# Patient Record
Sex: Male | Born: 1935 | Race: White | Hispanic: No | Marital: Married | Smoking: Never smoker
Health system: Southern US, Community
[De-identification: ages and names within clinical notes are randomized; demographics above are authoritative.]

## PROBLEM LIST (undated history)

## (undated) DIAGNOSIS — G309 Alzheimer's disease, unspecified: Secondary | ICD-10-CM

## (undated) DIAGNOSIS — F0280 Dementia in other diseases classified elsewhere without behavioral disturbance: Secondary | ICD-10-CM

## (undated) DIAGNOSIS — I10 Essential (primary) hypertension: Secondary | ICD-10-CM

## (undated) HISTORY — PX: HERNIA REPAIR: SHX51

## (undated) HISTORY — PX: MOUTH SURGERY: SHX715

## (undated) HISTORY — DX: Essential (primary) hypertension: I10

---

## 2006-02-05 ENCOUNTER — Ambulatory Visit: Payer: Self-pay | Admitting: Family Medicine

## 2007-02-04 ENCOUNTER — Encounter: Payer: Self-pay | Admitting: Family Medicine

## 2007-03-01 ENCOUNTER — Encounter: Payer: Self-pay | Admitting: Family Medicine

## 2008-06-30 ENCOUNTER — Ambulatory Visit: Payer: Self-pay | Admitting: Internal Medicine

## 2008-07-19 ENCOUNTER — Ambulatory Visit: Payer: Self-pay | Admitting: Internal Medicine

## 2008-07-31 ENCOUNTER — Ambulatory Visit: Payer: Self-pay | Admitting: Internal Medicine

## 2014-12-25 ENCOUNTER — Emergency Department: Payer: Self-pay | Admitting: Internal Medicine

## 2014-12-25 LAB — CBC WITH DIFFERENTIAL/PLATELET
BASOS ABS: 0.1 10*3/uL (ref 0.0–0.1)
Basophil %: 0.8 %
Eosinophil #: 0.4 10*3/uL (ref 0.0–0.7)
Eosinophil %: 3.4 %
HCT: 46.5 % (ref 40.0–52.0)
HGB: 15.5 g/dL (ref 13.0–18.0)
LYMPHS ABS: 1.8 10*3/uL (ref 1.0–3.6)
Lymphocyte %: 17.3 %
MCH: 31.2 pg (ref 26.0–34.0)
MCHC: 33.3 g/dL (ref 32.0–36.0)
MCV: 94 fL (ref 80–100)
MONO ABS: 1 x10 3/mm (ref 0.2–1.0)
MONOS PCT: 9.2 %
NEUTROS ABS: 7.2 10*3/uL — AB (ref 1.4–6.5)
Neutrophil %: 69.3 %
Platelet: 300 10*3/uL (ref 150–440)
RBC: 4.97 10*6/uL (ref 4.40–5.90)
RDW: 13.7 % (ref 11.5–14.5)
WBC: 10.4 10*3/uL (ref 3.8–10.6)

## 2014-12-25 LAB — BASIC METABOLIC PANEL
Anion Gap: 7 (ref 7–16)
BUN: 13 mg/dL
Calcium, Total: 9 mg/dL
Chloride: 101 mmol/L
Co2: 29 mmol/L
Creatinine: 0.79 mg/dL
EGFR (Non-African Amer.): >60
Glucose: 117 mg/dL — ABNORMAL HIGH
POTASSIUM: 3.7 mmol/L
Sodium: 137 mmol/L

## 2014-12-25 LAB — URINALYSIS, COMPLETE
Bacteria: NONE SEEN
Bilirubin,UR: NEGATIVE
GLUCOSE, UR: NEGATIVE mg/dL (ref 0–75)
Ketone: NEGATIVE
LEUKOCYTE ESTERASE: NEGATIVE
NITRITE: NEGATIVE
Ph: 5 (ref 4.5–8.0)
RBC,UR: <1 /HPF (ref 0–5)
Specific Gravity: 1.017 (ref 1.003–1.030)
Squamous Epithelial: NONE SEEN

## 2014-12-25 LAB — TROPONIN I

## 2015-02-14 ENCOUNTER — Other Ambulatory Visit: Payer: Self-pay | Admitting: Family Medicine

## 2015-02-14 ENCOUNTER — Ambulatory Visit
Admission: RE | Admit: 2015-02-14 | Discharge: 2015-02-14 | Disposition: A | Discharge status: Home / Self Care | Payer: PPO | Source: Ambulatory Visit | Attending: Family Medicine | Admitting: Family Medicine

## 2015-02-14 DIAGNOSIS — M25519 Pain in unspecified shoulder: Secondary | ICD-10-CM | POA: Insufficient documentation

## 2015-02-14 DIAGNOSIS — M5412 Radiculopathy, cervical region: Secondary | ICD-10-CM

## 2015-04-09 ENCOUNTER — Other Ambulatory Visit: Payer: Self-pay | Admitting: Family Medicine

## 2015-04-17 ENCOUNTER — Ambulatory Visit: Payer: Self-pay | Admitting: Family Medicine

## 2015-04-27 ENCOUNTER — Other Ambulatory Visit: Payer: Self-pay | Admitting: Family Medicine

## 2015-05-18 ENCOUNTER — Ambulatory Visit: Payer: Self-pay | Admitting: Family Medicine

## 2015-05-23 ENCOUNTER — Ambulatory Visit: Payer: Self-pay | Admitting: Family Medicine

## 2015-05-30 ENCOUNTER — Ambulatory Visit (INDEPENDENT_AMBULATORY_CARE_PROVIDER_SITE_OTHER): Payer: PPO

## 2015-05-30 DIAGNOSIS — Z23 Encounter for immunization: Secondary | ICD-10-CM | POA: Diagnosis not present

## 2015-07-20 ENCOUNTER — Ambulatory Visit: Payer: PPO | Admitting: Family Medicine

## 2015-07-25 ENCOUNTER — Ambulatory Visit: Payer: PPO | Admitting: Family Medicine

## 2015-10-19 ENCOUNTER — Ambulatory Visit: Payer: PPO | Admitting: Family Medicine

## 2015-12-07 ENCOUNTER — Ambulatory Visit: Payer: PPO | Admitting: Family Medicine

## 2016-01-17 ENCOUNTER — Ambulatory Visit: Payer: PPO | Admitting: Family Medicine

## 2016-02-24 ENCOUNTER — Other Ambulatory Visit: Payer: Self-pay | Admitting: Family Medicine

## 2016-05-06 ENCOUNTER — Ambulatory Visit: Payer: PPO | Admitting: Family Medicine

## 2016-05-17 ENCOUNTER — Other Ambulatory Visit: Payer: Self-pay | Admitting: Family Medicine

## 2016-05-27 NOTE — Telephone Encounter (Signed)
Pt.notified

## 2016-05-27 NOTE — Telephone Encounter (Signed)
Please find out in old med records system when patient was last actually seen in the office for a visit His last labs were over 17 months ago He has cancelled several appointments, including one with me August 7th and another one on August 31st I'm not comfortable sending in prescriptions if he's not compliant

## 2016-05-30 ENCOUNTER — Ambulatory Visit: Payer: PPO | Admitting: Family Medicine

## 2016-06-03 ENCOUNTER — Emergency Department
Admission: EM | Admit: 2016-06-03 | Discharge: 2016-06-04 | Disposition: A | Discharge status: Home / Self Care | Payer: PPO | Attending: Emergency Medicine | Admitting: Emergency Medicine

## 2016-06-03 ENCOUNTER — Encounter: Payer: Self-pay | Admitting: Emergency Medicine

## 2016-06-03 DIAGNOSIS — M25569 Pain in unspecified knee: Secondary | ICD-10-CM | POA: Diagnosis not present

## 2016-06-03 DIAGNOSIS — E876 Hypokalemia: Secondary | ICD-10-CM | POA: Diagnosis not present

## 2016-06-03 DIAGNOSIS — M79671 Pain in right foot: Secondary | ICD-10-CM | POA: Diagnosis not present

## 2016-06-03 DIAGNOSIS — M2550 Pain in unspecified joint: Secondary | ICD-10-CM | POA: Diagnosis not present

## 2016-06-03 DIAGNOSIS — K029 Dental caries, unspecified: Secondary | ICD-10-CM | POA: Insufficient documentation

## 2016-06-03 NOTE — ED Notes (Signed)
Patient denies pain and is resting comfortably.  

## 2016-06-03 NOTE — ED Notes (Signed)
Pt. Up attempting urine sample at this time.

## 2016-06-03 NOTE — ED Triage Notes (Signed)
Pt. C/o Right foot arch pain, that comes and goes, joint soreness generalized, and "bad teeth that need to come out that I think may be putting infection in my blood to make me feel like this."

## 2016-06-03 NOTE — ED Notes (Signed)
Due to pt. Suspicions of blood infection, rainbow labs, lactic acid, and 1 set of blood cultures have already been obtained and sent to lab, pending orders per MD evaluation.

## 2016-06-04 LAB — BASIC METABOLIC PANEL
ANION GAP: 9 (ref 5–15)
BUN: 11 mg/dL (ref 6–20)
CO2: 27 mmol/L (ref 22–32)
Calcium: 8.6 mg/dL — ABNORMAL LOW (ref 8.9–10.3)
Chloride: 101 mmol/L (ref 101–111)
Creatinine, Ser: 0.79 mg/dL (ref 0.61–1.24)
GFR calc Af Amer: >60 mL/min (ref >60)
GLUCOSE: 95 mg/dL (ref 65–99)
Potassium: 2.9 mmol/L — ABNORMAL LOW (ref 3.5–5.1)
Sodium: 137 mmol/L (ref 135–145)

## 2016-06-04 LAB — CBC
HEMATOCRIT: 38.2 % — AB (ref 40.0–52.0)
Hemoglobin: 13.3 g/dL (ref 13.0–18.0)
MCH: 31.7 pg (ref 26.0–34.0)
MCHC: 34.7 g/dL (ref 32.0–36.0)
MCV: 91.3 fL (ref 80.0–100.0)
Platelets: 405 10*3/uL (ref 150–440)
RBC: 4.19 MIL/uL — ABNORMAL LOW (ref 4.40–5.90)
RDW: 12.9 % (ref 11.5–14.5)
WBC: 11.1 10*3/uL — AB (ref 3.8–10.6)

## 2016-06-04 MED ORDER — POTASSIUM CHLORIDE CRYS ER 20 MEQ PO TBCR
40.0000 meq | EXTENDED_RELEASE_TABLET | Freq: Once | ORAL | Status: AC
  Administered 2016-06-04: 40 meq via ORAL
  Filled 2016-06-04: qty 2

## 2016-06-04 NOTE — ED Notes (Signed)
Family at bedside. 

## 2016-06-04 NOTE — ED Notes (Signed)
Vital signs stable. 

## 2016-06-04 NOTE — ED Provider Notes (Signed)
[facility] Emergency Department Provider Note   ____________________________________________   First MD Initiated Contact with Patient 06/03/16 2351     (approximate)  I have reviewed the triage vital signs and the nursing notes.   HISTORY  Chief Complaint Dental Injury; Foot Pain; and Joint Pain    HPI Brett Floyd is a 82 y.o. male comes into the hospital today with multiple complaints. He reports he had some problems with his right foot arch about 2 weeks ago. He reports that he had an injury 40 years ago but thinks it flared back up. He was taking ibuprofen and aspirin and the pain improved but he said that since then he's been having pain in his joints. He reports that his knees ankles and shoulders have been feeling bad. He reports it is sore feeling. The patient reports that his continued and he's feeling stiff. He reports that his son told him that he could have an infection in his blood from his rotten teeth and he is concerned he may have an infection in his bloodstream. He reports that he just doesn't feel good but rate that that the soreness in his joints. He denies any fevers, vomiting, nausea, dizziness, shortness of breath or chest pain. The patient reports he did have a cold sweat and he did feel like he has decreased energy. The patient reports that he just didn't like the way he was feeling which is why he came in to get some blood work to make sure he didn't have an infection spreading to his blood.The patient is here for evaluation.   History reviewed. No pertinent past medical history.  There are no active problems to display for this patient.   Past Surgical History:  Procedure Laterality Date  . HERNIA REPAIR    . MOUTH SURGERY      Prior to Admission medications   Medication Sig Start Date End Date Taking? Authorizing Provider  lisinopril (PRINIVIL,ZESTRIL) 10 MG tablet TAKE 1 TABLET BY MOUTH EVERYDAY 02/27/16   Ashok Norris, MD    Allergies Review of patient's allergies indicates no known allergies.  History reviewed. No pertinent family history.  Social History Social History  Substance Use Topics  . Smoking status: Never Smoker  . Smokeless tobacco: Never Used  . Alcohol use No    Review of Systems Constitutional: cold sweat Eyes: No visual changes. ENT: No sore throat. Cardiovascular: Denies chest pain. Respiratory: Denies shortness of breath. Gastrointestinal: No abdominal pain.  No nausea, no vomiting.  No diarrhea.  No constipation. Genitourinary: Negative for dysuria. Musculoskeletal: Arthralgia Skin: Negative for rash. Neurological: Negative for headaches, focal weakness or numbness.  10-point ROS otherwise negative.  ____________________________________________   PHYSICAL EXAM:  VITAL SIGNS: ED Triage Vitals  Enc Vitals Group     BP 06/03/16 2259 (!) 130/94     Pulse Rate 06/03/16 2259 97     Resp 06/03/16 2259 18     Temp 06/03/16 2259 98.5 F (36.9 C)     Temp Source 06/03/16 2259 Oral     SpO2 06/03/16 2259 97 %     Weight 06/03/16 2300 158 lb 3.2 oz (71.8 kg)     Height 06/03/16 2300 5\' 7"  (1.702 m)     Head Circumference --      Peak Flow --      Pain Score 06/03/16 2301 0     Pain Loc --      Pain Edu? --  Excl. in [place]? --     Constitutional: Alert and oriented. Well appearing and in no acute distress. Eyes: Conjunctivae are normal. PERRL. EOMI. Head: Atraumatic. Nose: No congestion/rhinnorhea. Mouth/Throat: Mucous membranes are moist.  Oropharynx non-erythematous. Cardiovascular: Normal rate, regular rhythm. Grossly normal heart sounds.  Good peripheral circulation. Respiratory: Normal respiratory effort.  No retractions. Lungs CTAB. Gastrointestinal: Soft and nontender. No distention. Positive bowel sounds Musculoskeletal: No lower extremity tenderness nor edema.   Neurologic:  Normal speech and language.  Skin:  Skin is warm, dry and intact.    Psychiatric: Mood and affect are normal.   ____________________________________________   LABS (all labs ordered are listed, but only abnormal results are displayed)  Labs Reviewed  CBC - Abnormal; Notable for the following:       Result Value   WBC 11.1 (*)    RBC 4.19 (*)    HCT 38.2 (*)    All other components within normal limits  BASIC METABOLIC PANEL - Abnormal; Notable for the following:    Potassium 2.9 (*)    Calcium 8.6 (*)    All other components within normal limits   ____________________________________________  EKG  ED ECG REPORT I, Loney Hering, the attending physician, personally viewed and interpreted this ECG.   Date: 06/03/2016  EKG Time: 2257  Rate: 98  Rhythm: normal sinus rhythm  Axis: normal  Intervals:none  ST&T Change: none  ____________________________________________  RADIOLOGY  none ____________________________________________   PROCEDURES  Procedure(s) performed: None  Procedures  Critical Care performed: No  ____________________________________________   INITIAL IMPRESSION / ASSESSMENT AND PLAN / ED COURSE  Pertinent labs & imaging results that were available during my care of the patient were reviewed by me and considered in my medical decision making (see chart for details).  This is an 82 year old male who comes into the hospital today with some body aches and chills. The patient is concerned that he may have an infection in his bloodstream due to some bad teeth. I will check some blood work to include a CBC and a BMP. I will reassess the patient once I received the results and determine if he needs any further studies or imaging. The patient otherwise is in no acute distress and no acute pain.  Clinical Course   Did give the patient a dose of potassium for his low potassium here. The patient's blood work otherwise is unremarkable. He has a white count of 11.1 but it is not severely abnormal from his previous.  He'll that the patient can be discharged home and should follow up with his primary care physician. He again is not having any pain at this time. He'll be discharged home.  ____________________________________________   FINAL CLINICAL IMPRESSION(S) / ED DIAGNOSES  Final diagnoses:  Hypokalemia  Arthralgia  Dental caries      NEW MEDICATIONS STARTED DURING THIS VISIT:  New Prescriptions   No medications on file     Note:  This document was prepared using Dragon voice recognition software and may include unintentional dictation errors.    Loney Hering, MD 06/04/16 [phone]

## 2016-06-21 ENCOUNTER — Ambulatory Visit
Admission: RE | Admit: 2016-06-21 | Discharge: 2016-06-21 | Disposition: A | Discharge status: Home / Self Care | Payer: PPO | Source: Ambulatory Visit | Attending: Family Medicine | Admitting: Family Medicine

## 2016-06-21 ENCOUNTER — Other Ambulatory Visit
Admission: RE | Admit: 2016-06-21 | Discharge: 2016-06-21 | Disposition: A | Discharge status: Home / Self Care | Payer: PPO | Source: Ambulatory Visit | Attending: Family Medicine | Admitting: Family Medicine

## 2016-06-21 ENCOUNTER — Ambulatory Visit (INDEPENDENT_AMBULATORY_CARE_PROVIDER_SITE_OTHER): Payer: PPO | Admitting: Family Medicine

## 2016-06-21 ENCOUNTER — Encounter: Payer: Self-pay | Admitting: Family Medicine

## 2016-06-21 VITALS — BP 118/88 | HR 83 | Temp 98.1°F | Resp 14 | Wt 160.7 lb

## 2016-06-21 DIAGNOSIS — E876 Hypokalemia: Secondary | ICD-10-CM | POA: Diagnosis not present

## 2016-06-21 DIAGNOSIS — I517 Cardiomegaly: Secondary | ICD-10-CM | POA: Diagnosis not present

## 2016-06-21 DIAGNOSIS — R61 Generalized hyperhidrosis: Secondary | ICD-10-CM

## 2016-06-21 DIAGNOSIS — R634 Abnormal weight loss: Secondary | ICD-10-CM | POA: Diagnosis not present

## 2016-06-21 DIAGNOSIS — D72829 Elevated white blood cell count, unspecified: Secondary | ICD-10-CM | POA: Diagnosis not present

## 2016-06-21 DIAGNOSIS — I10 Essential (primary) hypertension: Secondary | ICD-10-CM | POA: Insufficient documentation

## 2016-06-21 LAB — CBC
HCT: 41.4 % (ref 40.0–52.0)
HEMOGLOBIN: 14 g/dL (ref 13.0–18.0)
MCH: 30.8 pg (ref 26.0–34.0)
MCHC: 33.7 g/dL (ref 32.0–36.0)
MCV: 91.5 fL (ref 80.0–100.0)
Platelets: 443 10*3/uL — ABNORMAL HIGH (ref 150–440)
RBC: 4.52 MIL/uL (ref 4.40–5.90)
RDW: 13 % (ref 11.5–14.5)
WBC: 11.2 10*3/uL — ABNORMAL HIGH (ref 3.8–10.6)

## 2016-06-21 LAB — COMPREHENSIVE METABOLIC PANEL
ALT: 28 U/L (ref 17–63)
AST: 30 U/L (ref 15–41)
Albumin: 3.3 g/dL — ABNORMAL LOW (ref 3.5–5.0)
Alkaline Phosphatase: 43 U/L (ref 38–126)
Anion gap: 3 — ABNORMAL LOW (ref 5–15)
BUN: 12 mg/dL (ref 6–20)
CHLORIDE: 101 mmol/L (ref 101–111)
CO2: 31 mmol/L (ref 22–32)
CREATININE: 0.86 mg/dL (ref 0.61–1.24)
Calcium: 8.6 mg/dL — ABNORMAL LOW (ref 8.9–10.3)
GFR calc Af Amer: >60 mL/min (ref >60)
GFR calc non Af Amer: >60 mL/min (ref >60)
Glucose, Bld: 102 mg/dL — ABNORMAL HIGH (ref 65–99)
Potassium: 3.5 mmol/L (ref 3.5–5.1)
Sodium: 135 mmol/L (ref 135–145)
Total Bilirubin: 0.9 mg/dL (ref 0.3–1.2)
Total Protein: 7.4 g/dL (ref 6.5–8.1)

## 2016-06-21 LAB — PHOSPHORUS: Phosphorus: 2.9 mg/dL (ref 2.5–4.6)

## 2016-06-21 LAB — LACTATE DEHYDROGENASE: LDH: 132 U/L (ref 98–192)

## 2016-06-21 LAB — TSH: TSH: 2.414 u[IU]/mL (ref 0.350–4.500)

## 2016-06-21 LAB — MAGNESIUM: Magnesium: 2 mg/dL (ref 1.7–2.4)

## 2016-06-21 MED ORDER — POTASSIUM CHLORIDE ER 10 MEQ PO TBCR: 10.0000 meq | EXTENDED_RELEASE_TABLET | Freq: Every day | ORAL | 0 refills | Status: DC

## 2016-06-21 NOTE — Assessment & Plan Note (Signed)
Patient believes all intentional, but will still check TSH, CBC, CMP

## 2016-06-21 NOTE — Assessment & Plan Note (Signed)
Decrease blood pressure medicine to 5 mg daily if systolic is [id number] or higher Do not take any today Monitor BP daily Stay hydrated

## 2016-06-21 NOTE — Progress Notes (Signed)
BP 118/88   Pulse 83   Temp 98.1 F (36.7 C) (Oral)   Resp 14   Wt 160 lb 11.2 oz (72.9 kg)   SpO2 95%   BMI 25.17 kg/m    Subjective:    Patient ID: Brett Floyd, male    DOB: 05/27/1934, 82 y.o.   MRN: [id number]  HPI: Brett Floyd is a 82 y.o. male  Chief Complaint  Patient presents with  . ER follow-up   Patient is here for ER follow-up; this is my first time seeing him as a patient He went to the ER and was found to have low potassium Felt weak and washed out Had a problem with it in his 40s too He was sweating a lot in the summer and maybe that was the cause way back then, but not now He had an episode of magnesium No skipped heartbeats The only sign was that he was hurting in his ankles and up to midshin region, in his feet and arches He has bad varicose veins and his mother had the same problem; no hx of gout Tore some ligaments in the right foot fairly bad when young, chalked it up to that and getting older That one night, he called his son and took him to the hospital No diarrhea prior to this Not bothered with diarrhea unless it's rare No excessive work or sweating prior to ER visit Has woken up at night, just a little sweating at night around neck and upper chest, but sleeps with arms crossed up around neck No unexplained weight loss; he has been working on that, he has it where he wants it now He has been having pain in the ankles and feet pain with walking; not swelling now  Father had stroke at 91 that almost killed him; he lived to be 91; smoked unfiltered [place] Flag went up for patient to take care of himself  Reviewed last labs His WBC in the ER was slightly elevated at 11.1 Potassium was 2.9; no Mg2+ was checked His calcium level was 8.6 Depression screen PHQ 2/9 06/21/2016  Decreased Interest 0  Down, Depressed, Hopeless 0  PHQ - 2 Score 0   Relevant past medical, surgical, family and social history reviewed Past Medical History:    Diagnosis Date  . Hypertension     Past Surgical History:  Procedure Laterality Date  . HERNIA REPAIR    . MOUTH SURGERY    MD note; surgery on left great toenail; left inguinal herniorrhaphy  Family History  Problem Relation Age of Onset  . Cancer Mother     pancreatic cancer  . Hypertension Father   . Stroke Father   . Heart disease Father   . Hypertension Son     Social History  Substance Use Topics  . Smoking status: Never Smoker  . Smokeless tobacco: Never Used  . Alcohol use No   Interim medical history since last visit reviewed. Allergies and medications reviewed  Review of Systems Per HPI unless specifically indicated above     Objective:    BP 118/88   Pulse 83   Temp 98.1 F (36.7 C) (Oral)   Resp 14   Wt 160 lb 11.2 oz (72.9 kg)   SpO2 95%   BMI 25.17 kg/m   Wt Readings from Last 3 Encounters:  06/21/16 160 lb 11.2 oz (72.9 kg)  06/03/16 158 lb 3.2 oz (71.8 kg)    Physical Exam  Constitutional: He appears well-developed  and well-nourished. No distress.  HENT:  Head: Normocephalic and atraumatic.  Eyes: EOM are normal. No scleral icterus.  Neck: No thyromegaly present.  Cardiovascular: Normal rate, regular rhythm and normal heart sounds.   No extrasystoles are present. Exam reveals no gallop.   No murmur heard. Pulmonary/Chest: Effort normal and breath sounds normal. He has no wheezes.  Abdominal: Soft. Bowel sounds are normal. He exhibits no distension. There is tenderness (mild tenderness to palpation in the RUQ, no mass).  Musculoskeletal: He exhibits no edema.  Neurological: He displays no tremor. Coordination normal.  Reflex Scores:      Patellar reflexes are 1+ on the right side and 1+ on the left side. Skin: Skin is warm and dry. No pallor.  Significant varicose veins both legs  Psychiatric: He has a normal mood and affect. His behavior is normal. Judgment and thought content normal. His mood appears not anxious. Cognition and memory  are not impaired. He does not exhibit a depressed mood.       Assessment & Plan:   Problem List Items Addressed This Visit      Cardiovascular and Mediastinum   LVH (left ventricular hypertrophy)    Check echocardiogram      Relevant Medications   lisinopril (PRINIVIL,ZESTRIL) 10 MG tablet   aspirin EC 81 MG tablet   Other Relevant Orders   ECHOCARDIOGRAM COMPLETE   DG Chest 2 View (Completed)   Essential hypertension, benign    Decrease blood pressure medicine to 5 mg daily if systolic is [id number] or higher Do not take any today Monitor BP daily Stay hydrated      Relevant Medications   lisinopril (PRINIVIL,ZESTRIL) 10 MG tablet   aspirin EC 81 MG tablet     Other   Weight loss    Patient believes all intentional, but will still check TSH, CBC, CMP      Relevant Orders   TSH   Hypokalemia - Primary   Relevant Orders   Magnesium   Hypocalcemia    Check Ca2+, vit D, phos      Relevant Orders   VITAMIN D 25 Hydroxy (Vit-D Deficiency, Fractures)   Phosphorus   Comprehensive metabolic panel   Elevated white blood cell count   Relevant Orders   CBC with Differential/Platelet    Other Visit Diagnoses    Night sweats       Relevant Orders   CBC with Differential/Platelet   Lactate Dehydrogenase (LDH)   C-reactive protein   DG Chest 2 View (Completed)      Follow up plan: Return in about 2 weeks (around 07/05/2016).  An after-visit summary was printed and given to the patient at [place].  Please see the patient instructions which may contain other information and recommendations beyond what is mentioned above in the assessment and plan.  Meds ordered this encounter  Medications  . lisinopril (PRINIVIL,ZESTRIL) 10 MG tablet    Sig: Take 0.5 tablets (5 mg total) by mouth daily. Take only if top blood pressure is 130 or higher    Please call office for appointment  . aspirin EC 81 MG tablet    Sig: Take 1 tablet (81 mg total) by mouth daily.  . potassium  chloride (KLOR-CON 10) 10 MEQ tablet    Sig: Take 1 tablet (10 mEq total) by mouth daily.    Dispense:  3 tablet    Refill:  0    Orders Placed This Encounter  Procedures  . DG Chest 2 View  .  TSH  . CBC with Differential/Platelet  . Lactate Dehydrogenase (LDH)  . VITAMIN D 25 Hydroxy (Vit-D Deficiency, Fractures)  . Phosphorus  . Comprehensive metabolic panel  . Magnesium  . C-reactive protein  . ECHOCARDIOGRAM COMPLETE   Home number confirmed, back up is [phone], son's [phone]

## 2016-06-21 NOTE — Assessment & Plan Note (Signed)
Check Ca2+, vit D, phos

## 2016-06-21 NOTE — Assessment & Plan Note (Signed)
Check echocardiogram 

## 2016-06-21 NOTE — Patient Instructions (Addendum)
Please go from here to the hospital for labs and a chest xray Do not take your blood pressure tonight Stay well-hydrated Do not take your blood pressure medicine if your top blood pressure number is less than 130 Decrease your usual lisinopril to just 5 mg if systolic BP is [id number] or higher Try the DASH guidelines  DASH Eating Plan DASH stands for "Dietary Approaches to Stop Hypertension." The DASH eating plan is a healthy eating plan that has been shown to reduce high blood pressure (hypertension). Additional health benefits may include reducing the risk of type 2 diabetes mellitus, heart disease, and stroke. The DASH eating plan may also help with weight loss. WHAT DO I NEED TO KNOW ABOUT THE DASH EATING PLAN? For the DASH eating plan, you will follow these general guidelines:  Choose foods with a percent daily value for sodium of less than 5% (as listed on the food label).  Use salt-free seasonings or herbs instead of table salt or sea salt.  Check with your health care provider or pharmacist before using salt substitutes.  Eat lower-sodium products, often labeled as "lower sodium" or "no salt added."  Eat fresh foods.  Eat more vegetables, fruits, and low-fat dairy products.  Choose whole grains. Look for the word "whole" as the first word in the ingredient list.  Choose fish and skinless chicken or [place] more often than red meat. Limit fish, poultry, and meat to 6 oz (170 g) each day.  Limit sweets, desserts, sugars, and sugary drinks.  Choose heart-healthy fats.  Limit cheese to 1 oz (28 g) per day.  Eat more home-cooked food and less restaurant, buffet, and fast food.  Limit fried foods.  Cook foods using methods other than frying.  Limit canned vegetables. If you do use them, rinse them well to decrease the sodium.  When eating at a restaurant, ask that your food be prepared with less salt, or no salt if possible. WHAT FOODS CAN I EAT? Seek help from a dietitian for  individual calorie needs. Grains Whole grain or whole wheat bread. Brown rice. Whole grain or whole wheat pasta. Quinoa, bulgur, and whole grain cereals. Low-sodium cereals. Corn or whole wheat flour tortillas. Whole grain cornbread. Whole grain crackers. Low-sodium crackers. Vegetables Fresh or frozen vegetables (raw, steamed, roasted, or grilled). Low-sodium or reduced-sodium tomato and vegetable juices. Low-sodium or reduced-sodium tomato sauce and paste. Low-sodium or reduced-sodium canned vegetables.  Fruits All fresh, canned (in natural juice), or frozen fruits. Meat and Other Protein Products Ground beef (85% or leaner), grass-fed beef, or beef trimmed of fat. Skinless chicken or [place]. Ground chicken or [place]. Pork trimmed of fat. All fish and seafood. Eggs. Dried beans, peas, or lentils. Unsalted nuts and seeds. Unsalted canned beans. Dairy Low-fat dairy products, such as skim or 1% milk, 2% or reduced-fat cheeses, low-fat ricotta or cottage cheese, or plain low-fat yogurt. Low-sodium or reduced-sodium cheeses. Fats and Oils Tub margarines without trans fats. Light or reduced-fat mayonnaise and salad dressings (reduced sodium). Avocado. Safflower, olive, or canola oils. Natural peanut or almond butter. Other Unsalted popcorn and pretzels. The items listed above may not be a complete list of recommended foods or beverages. Contact your dietitian for more options. WHAT FOODS ARE NOT RECOMMENDED? Grains White bread. White pasta. White rice. Refined cornbread. Bagels and croissants. Crackers that contain trans fat. Vegetables Creamed or fried vegetables. Vegetables in a cheese sauce. Regular canned vegetables. Regular canned tomato sauce and paste. Regular tomato and vegetable juices. Fruits  Dried fruits. Canned fruit in light or heavy syrup. Fruit juice. Meat and Other Protein Products Fatty cuts of meat. Ribs, chicken wings, bacon, sausage, bologna, salami, chitterlings, fatback, hot  dogs, bratwurst, and packaged luncheon meats. Salted nuts and seeds. Canned beans with salt. Dairy Whole or 2% milk, cream, half-and-half, and cream cheese. Whole-fat or sweetened yogurt. Full-fat cheeses or blue cheese. Nondairy creamers and whipped toppings. Processed cheese, cheese spreads, or cheese curds. Condiments Onion and garlic salt, seasoned salt, table salt, and sea salt. Canned and packaged gravies. Worcestershire sauce. Tartar sauce. Barbecue sauce. Teriyaki sauce. Soy sauce, including reduced sodium. Steak sauce. Fish sauce. Oyster sauce. Cocktail sauce. Horseradish. Ketchup and mustard. Meat flavorings and tenderizers. Bouillon cubes. Hot sauce. Tabasco sauce. Marinades. Taco seasonings. Relishes. Fats and Oils Butter, stick margarine, lard, shortening, ghee, and bacon fat. Coconut, palm kernel, or palm oils. Regular salad dressings. Other Pickles and olives. Salted popcorn and pretzels. The items listed above may not be a complete list of foods and beverages to avoid. Contact your dietitian for more information. WHERE CAN I FIND MORE INFORMATION? National Heart, Lung, and Blood Institute: travelstabloid.com   This information is not intended to replace advice given to you by your health care provider. Make sure you discuss any questions you have with your health care provider.   Document Released: 09/05/2011 Document Revised: 10/07/2014 Document Reviewed: 07/21/2013 Elsevier Interactive Patient Education [employer].

## 2016-06-22 LAB — VITAMIN D 25 HYDROXY (VIT D DEFICIENCY, FRACTURES): Vit D, 25-Hydroxy: 36.9 ng/mL (ref 30.0–100.0)

## 2016-06-23 ENCOUNTER — Other Ambulatory Visit
Admission: RE | Admit: 2016-06-23 | Discharge: 2016-06-23 | Disposition: A | Discharge status: Home / Self Care | Payer: PPO | Source: Ambulatory Visit | Attending: Family Medicine | Admitting: Family Medicine

## 2016-06-23 DIAGNOSIS — R319 Hematuria, unspecified: Secondary | ICD-10-CM | POA: Insufficient documentation

## 2016-06-23 LAB — URINALYSIS COMPLETE WITH MICROSCOPIC (ARMC ONLY)
BILIRUBIN URINE: NEGATIVE
Bacteria, UA: NONE SEEN
GLUCOSE, UA: NEGATIVE mg/dL
KETONES UR: NEGATIVE mg/dL
Leukocytes, UA: NEGATIVE
NITRITE: NEGATIVE
PH: 5 (ref 5.0–8.0)
Protein, ur: NEGATIVE mg/dL
Specific Gravity, Urine: 1.019 (ref 1.005–1.030)

## 2016-06-23 LAB — C-REACTIVE PROTEIN: CRP: 2 mg/dL — ABNORMAL HIGH (ref <1.0)

## 2016-06-25 LAB — URINE CULTURE: Culture: NO GROWTH

## 2016-06-26 ENCOUNTER — Ambulatory Visit: Payer: PPO

## 2016-06-26 VITALS — BP 118/84 | HR 78 | Resp 14

## 2016-06-26 DIAGNOSIS — I10 Essential (primary) hypertension: Secondary | ICD-10-CM

## 2016-06-27 ENCOUNTER — Other Ambulatory Visit: Payer: Self-pay | Admitting: Family Medicine

## 2016-06-28 ENCOUNTER — Other Ambulatory Visit: Payer: Self-pay

## 2016-06-28 MED ORDER — LISINOPRIL 10 MG PO TABS: 5.0000 mg | ORAL_TABLET | Freq: Every day | ORAL | 0 refills | Status: DC

## 2016-06-28 NOTE — Telephone Encounter (Signed)
Patient ins requires 90 day

## 2016-07-08 ENCOUNTER — Ambulatory Visit: Payer: PPO | Admitting: Family Medicine

## 2016-07-18 ENCOUNTER — Ambulatory Visit: Admission: RE | Admit: 2016-07-18 | Payer: PPO | Source: Ambulatory Visit

## 2016-07-25 ENCOUNTER — Encounter: Payer: Self-pay | Admitting: Family Medicine

## 2016-07-25 ENCOUNTER — Ambulatory Visit (INDEPENDENT_AMBULATORY_CARE_PROVIDER_SITE_OTHER): Payer: PPO | Admitting: Family Medicine

## 2016-07-25 DIAGNOSIS — E876 Hypokalemia: Secondary | ICD-10-CM | POA: Diagnosis not present

## 2016-07-25 DIAGNOSIS — D72828 Other elevated white blood cell count: Secondary | ICD-10-CM | POA: Diagnosis not present

## 2016-07-25 DIAGNOSIS — I83813 Varicose veins of bilateral lower extremities with pain: Secondary | ICD-10-CM

## 2016-07-25 DIAGNOSIS — I10 Essential (primary) hypertension: Secondary | ICD-10-CM | POA: Diagnosis not present

## 2016-07-25 DIAGNOSIS — R7982 Elevated C-reactive protein (CRP): Secondary | ICD-10-CM

## 2016-07-25 LAB — CBC WITH DIFFERENTIAL/PLATELET
BASOS ABS: 92 {cells}/uL (ref 0–200)
Basophils Relative: 1 %
EOS PCT: 4 %
Eosinophils Absolute: 368 cells/uL (ref 15–500)
HCT: 40.7 % (ref 38.5–50.0)
Hemoglobin: 13.9 g/dL (ref 13.2–17.1)
Lymphocytes Relative: 19 %
Lymphs Abs: 1748 cells/uL (ref 850–3900)
MCH: 30.6 pg (ref 27.0–33.0)
MCHC: 34.2 g/dL (ref 32.0–36.0)
MCV: 89.6 fL (ref 80.0–100.0)
MONOS PCT: 11 %
MPV: 8.9 fL (ref 7.5–12.5)
Monocytes Absolute: 1012 cells/uL — ABNORMAL HIGH (ref 200–950)
NEUTROS ABS: 5980 {cells}/uL (ref 1500–7800)
Neutrophils Relative %: 65 %
PLATELETS: 426 10*3/uL — AB (ref 140–400)
RBC: 4.54 MIL/uL (ref 4.20–5.80)
RDW: 13.6 % (ref 11.0–15.0)
WBC: 9.2 10*3/uL (ref 3.8–10.8)

## 2016-07-25 NOTE — Assessment & Plan Note (Signed)
Well controlled today.

## 2016-07-25 NOTE — Patient Instructions (Signed)
Let's get labs today If you have not heard anything from my staff in a week about any orders/referrals/studies from today, please contact us here to follow-up [phone] Stay active

## 2016-07-25 NOTE — Assessment & Plan Note (Signed)
Check lab

## 2016-07-25 NOTE — Assessment & Plan Note (Signed)
Check level 

## 2016-07-25 NOTE — Assessment & Plan Note (Signed)
Check level today 

## 2016-07-25 NOTE — Assessment & Plan Note (Signed)
Check CBC 

## 2016-07-25 NOTE — Assessment & Plan Note (Signed)
Refer to vascular doctor 

## 2016-07-25 NOTE — Progress Notes (Signed)
BP 112/84 (BP Location: Left Arm, Patient Position: Sitting, Cuff Size: Normal)   Pulse 98   Temp 97.5 F (36.4 C) (Oral)   Resp 16   Ht 5\' 5"  (1.651 m)   Wt 158 lb (71.7 kg)   SpO2 96%   BMI 26.29 kg/m    Subjective:    Patient ID: Brett Floyd, male    DOB: [birth year], 81 y.o.   MRN: [id number]  HPI: Brett Floyd is a 82 y.o. male  Chief Complaint  Patient presents with  . Follow-up    2 weeks  . Hypokalemia    Doing well when taking potassium medication and trying to eat more bananas and yogurt. But states now since he has been off of medication his feet and knees are aching again.    He was at the hospital prior to last visit; was found to have low potassium No problems with body aches He is having problems from the knee down Not sure if K+ is back to normal He eats foods with K+, a banana a day and a cup of yogurt a day, but had to stop the banana He is having pain in the feet and midshin He is not wearing compression stockings now; the ones he bought to try were too tight Aching soreness in the legs Dr. Rutherford Nail encouraged him to use compression stockings before  If BP staying at 130 or under, quit taking BP medicine No more weight loss, maybe two pounds; wife says he doesn't eat much, "neither one of US done" He was out active every day before this K+ thing happened  Depression screen [facility] 2/9 06/21/2016  Decreased Interest 0  Down, Depressed, Hopeless 0  PHQ - 2 Score 0   Relevant past medical, surgical, family and social history reviewed Past Medical History:  Diagnosis Date  . Hypertension    Past Surgical History:  Procedure Laterality Date  . HERNIA REPAIR    . MOUTH SURGERY     Family History  Problem Relation Age of Onset  . Cancer Mother     pancreatic cancer  . Hypertension Father   . Stroke Father   . Heart disease Father   . Hypertension Son    Social History  Substance Use Topics  . Smoking status: Never Smoker  .  Smokeless tobacco: Never Used  . Alcohol use No   Interim medical history since last visit reviewed. Allergies and medications reviewed  Review of Systems Per HPI unless specifically indicated above     Objective:    BP 112/84 (BP Location: Left Arm, Patient Position: Sitting, Cuff Size: Normal)   Pulse 98   Temp 97.5 F (36.4 C) (Oral)   Resp 16   Ht 5\' 5"  (1.651 m)   Wt 158 lb (71.7 kg)   SpO2 96%   BMI 26.29 kg/m   Wt Readings from Last 3 Encounters:  08/02/16 154 lb (69.9 kg)  07/25/16 158 lb (71.7 kg)  06/21/16 160 lb 11.2 oz (72.9 kg)    Physical Exam  Constitutional: He appears well-developed and well-nourished. No distress.  HENT:  Head: Normocephalic and atraumatic.  Eyes: EOM are normal. No scleral icterus.  Neck: No thyromegaly present.  Cardiovascular: Normal rate, regular rhythm and normal heart sounds.   No extrasystoles are present. Exam reveals no gallop.   No murmur heard. Pulmonary/Chest: Effort normal and breath sounds normal. He has no wheezes.  Abdominal: Soft. Bowel sounds are normal. He exhibits no distension.  There is no tenderness.  Musculoskeletal: He exhibits no edema.  Significant varicose veins in both lower extremities; no ulcerations  Neurological: He is alert. He displays no tremor.  Reflex Scores:      Patellar reflexes are 1+ on the right side and 1+ on the left side. Skin: Skin is warm and dry. No pallor.  Significant varicose veins both legs  Psychiatric: He has a normal mood and affect. His behavior is normal. Judgment and thought content normal. His mood appears not anxious. Cognition and memory are not impaired. He does not exhibit a depressed mood.      Assessment & Plan:   Problem List Items Addressed This Visit      Cardiovascular and Mediastinum   Varicose veins of both lower extremities with pain    Refer to vascular doctor      Essential hypertension, benign    Well-controlled today        Other   Hypokalemia      Check level today      Relevant Orders   Magnesium (Completed)   Hypocalcemia    Check level      Relevant Orders   BASIC METABOLIC PANEL WITH GFR (Completed)   Elevated white blood cell count    Check CBC      Relevant Orders   CBC with Differential/Platelet (Completed)   CRP elevated    Check lab      Relevant Orders   C-reactive protein (Completed)      Follow up plan: No Follow-up on file.  An after-visit summary was printed and given to the patient at [place].  Please see the patient instructions which may contain other information and recommendations beyond what is mentioned above in the assessment and plan.  No orders of the defined types were placed in this encounter.   Orders Placed This Encounter  Procedures  . CBC with Differential/Platelet  . C-reactive protein  . Magnesium  . BASIC METABOLIC PANEL WITH GFR

## 2016-07-26 ENCOUNTER — Telehealth: Payer: Self-pay | Admitting: Family Medicine

## 2016-07-26 LAB — BASIC METABOLIC PANEL WITH GFR
BUN: 11 mg/dL (ref 7–25)
CALCIUM: 9.6 mg/dL (ref 8.6–10.3)
CO2: 26 mmol/L (ref 20–31)
Chloride: 99 mmol/L (ref 98–110)
Creat: 0.91 mg/dL (ref 0.70–1.11)
GFR, EST NON AFRICAN AMERICAN: 79 mL/min (ref >=60)
GLUCOSE: 99 mg/dL (ref 65–99)
Potassium: 4.1 mmol/L (ref 3.5–5.3)
Sodium: 137 mmol/L (ref 135–146)

## 2016-07-26 LAB — C-REACTIVE PROTEIN: CRP: 93.1 mg/L — AB (ref <8.0)

## 2016-07-26 LAB — MAGNESIUM: Magnesium: 2.1 mg/dL (ref 1.5–2.5)

## 2016-07-26 NOTE — Telephone Encounter (Signed)
Patient's wife called wanting to get clarity on Brett Floyd's medications.  She wanted to know if he was suppose to pick up some medications on yesterday or is he needing to wait on his test results from the visit on 07/25/16?

## 2016-07-26 NOTE — Telephone Encounter (Signed)
CRP is very high I called patient's home number, left detailed msg; would like him to think through everything that may be bothering him, any symptom, any way, anything at all, so we can do testing and work this up Explained K+ is actually normal It's Friday evening, so I'll try him tomorrow

## 2016-07-26 NOTE — Telephone Encounter (Signed)
left voicemail no meds had been called in, once Dr. Sanda Klein reviewed labs we would call with details.

## 2016-07-27 NOTE — Telephone Encounter (Signed)
I called, left another msg; will try him tomorrow or Monday

## 2016-07-28 NOTE — Telephone Encounter (Signed)
I called again; reached voicemail; if any serious s/s, go to ER; otherwise, call me Monday; we have work-up to do

## 2016-07-29 ENCOUNTER — Telehealth: Payer: Self-pay | Admitting: Family Medicine

## 2016-07-29 NOTE — Telephone Encounter (Signed)
I called patient back; see other phone note I reached voicemail, will try again later today

## 2016-07-29 NOTE — Telephone Encounter (Signed)
I tried his wife's number twice today; on 2nd failed attempt to talk to him, I left detailed msg, please just come in for an appt tomorrow (Tuesday) at 11:20 am

## 2016-07-29 NOTE — Telephone Encounter (Signed)
Patient returned call

## 2016-07-29 NOTE — Telephone Encounter (Signed)
I called again; reached voicemail; asked him to please call me back

## 2016-07-29 NOTE — Telephone Encounter (Signed)
PT HAS CALLED SAYING HE IS NOT RECEIVING YOUR CALLS. HE IS ONLY SEEING HE HAS A MISSED CALL SO HE WANTS YOU TO CALL HIM ON HIS WIFE PHONE AT [phone]

## 2016-07-29 NOTE — Telephone Encounter (Signed)
I called again, left message that I need to address his labs with him; I would like him to just come to the office tomorrow (Tuesday) at 11:20 am and we'll work him in

## 2016-07-30 NOTE — Telephone Encounter (Signed)
Patient was not able to be seen today due to transportation. I put him in to be seen on 08/02/16

## 2016-08-02 ENCOUNTER — Ambulatory Visit (INDEPENDENT_AMBULATORY_CARE_PROVIDER_SITE_OTHER): Payer: PPO | Admitting: Family Medicine

## 2016-08-02 ENCOUNTER — Encounter: Payer: Self-pay | Admitting: Family Medicine

## 2016-08-02 DIAGNOSIS — R7982 Elevated C-reactive protein (CRP): Secondary | ICD-10-CM

## 2016-08-02 DIAGNOSIS — R634 Abnormal weight loss: Secondary | ICD-10-CM | POA: Diagnosis not present

## 2016-08-02 DIAGNOSIS — R319 Hematuria, unspecified: Secondary | ICD-10-CM | POA: Diagnosis not present

## 2016-08-02 LAB — PSA: PSA: 2.3 ng/mL (ref <=4.0)

## 2016-08-02 LAB — T4, FREE: Free T4: 1.1 ng/dL (ref 0.8–1.8)

## 2016-08-02 NOTE — Progress Notes (Signed)
BP 138/84   Pulse (!) 105   Temp 97.8 F (36.6 C) (Oral)   Resp 14   Wt 154 lb (69.9 kg)   SpO2 93%   BMI 25.63 kg/m    Subjective:    Patient ID: Brett Floyd, male    DOB: Jul 20, 1934, 82 y.o.   MRN: [id number]  HPI: Brett Floyd is a 82 y.o. male  Chief Complaint  Patient presents with  . Labs Only    review   Family has noticed a huge decline The right knee is bothering him so much, he can't walk on it; has it wrapped with ace bandage right knee pain, but really no other joints The knee is just overwhelmingly hurting; has knocked him down; there have been times that he couldn't put an ounce of weight on it before He has no appetite per se; losing weight; he was trying to get down to 150 pounds Likes something sweet and cold; yogurt and jell-O, yoplait whips; eats cookies, cheesecake; not getting enough veggies and meat No belly pain, no dysuria; no blood in the urine or stool He has terrible teeth and needs to get them pulled He has has a little night sweats once in a while Has hx of really bad allergies and had proteins elevated because of that once Cancer screening reviewed; colonoscopy was 10 years ago; no prostate symptoms They gutted the walls after having water damage; no mold or mildew Seminal gland was enlarged; very common per urologist; he was told to not get upset; no pain   Depression screen [facility] 2/9 06/21/2016  Decreased Interest 0  Down, Depressed, Hopeless 0  PHQ - 2 Score 0    No flowsheet data found.  Relevant past medical, surgical, family and social history reviewed Past Medical History:  Diagnosis Date  . Hypertension    Past Surgical History:  Procedure Laterality Date  . HERNIA REPAIR    . MOUTH SURGERY     Family History  Problem Relation Age of Onset  . Cancer Mother     pancreatic cancer  . Hypertension Father   . Stroke Father   . Heart disease Father   . Hypertension Son    Social History  Substance Use Topics    . Smoking status: Never Smoker  . Smokeless tobacco: Never Used  . Alcohol use No    Interim medical history since last visit reviewed. Allergies and medications reviewed  Review of Systems  Constitutional: Negative for diaphoresis and fever.  HENT: Positive for dental problem, postnasal drip, rhinorrhea (massive amount of allergies, postnasal drip) and voice change (more frail, weakness). Negative for ear pain, nosebleeds, sore throat and tinnitus.   Respiratory: Negative for cough, shortness of breath and wheezing.   Gastrointestinal: Negative for abdominal pain, blood in stool, diarrhea, nausea and vomiting.  Endocrine: Positive for cold intolerance. Negative for polydipsia.  Genitourinary: Negative for decreased urine volume, difficulty urinating, dysuria and hematuria.  Musculoskeletal: Positive for arthralgias. Negative for myalgias (just biceps from pushing to stand).  Allergic/Immunologic: Negative for food allergies.  Neurological: Positive for light-headedness. Negative for dizziness, tremors and headaches.  Hematological: Negative for adenopathy. Does not bruise/bleed easily.   Per HPI unless specifically indicated above     Objective:    BP 138/84   Pulse (!) 105   Temp 97.8 F (36.6 C) (Oral)   Resp 14   Wt 154 lb (69.9 kg)   SpO2 93%   BMI 25.63 kg/m  Wt Readings from Last 3 Encounters:  08/02/16 154 lb (69.9 kg)  07/25/16 158 lb (71.7 kg)  06/21/16 160 lb 11.2 oz (72.9 kg)   close to 175 or 180 pounds a year ago  Physical Exam  Constitutional: He appears well-developed and well-nourished. No distress.  Weight down 4 pounds over last week  HENT:  Head: Normocephalic and atraumatic.  Mouth/Throat: Dental caries present.  Dentition is terrible repair; many teeth eroded down to the gum line  Eyes: EOM are normal. No scleral icterus.  Neck: No thyromegaly present.  Cardiovascular: Normal rate, regular rhythm and normal heart sounds.   No extrasystoles are  present. Exam reveals no gallop.   No murmur heard. Pulmonary/Chest: Effort normal and breath sounds normal. He has no wheezes.  Abdominal: Soft. Bowel sounds are normal. He exhibits no distension. There is no tenderness.  Musculoskeletal: He exhibits no edema.  Significant varicose veins in both lower extremities; no ulcerations  Neurological: He is alert. He displays no tremor.  Reflex Scores:      Patellar reflexes are 1+ on the right side and 1+ on the left side. Skin: Skin is warm and dry. No pallor.  Significant varicose veins both legs  Psychiatric: He has a normal mood and affect. His behavior is normal. Judgment and thought content normal. His mood appears not anxious. Cognition and memory are not impaired. He does not exhibit a depressed mood.      Assessment & Plan:   Problem List Items Addressed This Visit      Other   Weight loss    Will get CT scan chest, abd, pelv; discussed possibility of malignancy; need to work up with close f/u      Relevant Orders   Urinalysis w microscopic + reflex cultur (Completed)   PSA (Completed)   Lipase (Completed)   Amylase (Completed)   T4, free (Completed)   CT Chest W Contrast   CT Abdomen Pelvis W Contrast   CRP elevated    Check ANA, explained concern for malignancy; will get CT scan chest, abd, pelvis; doubt all due to poor dentition, though we discussed this as a causative factor in heart disease      Relevant Orders   ANA,IFA RA Diag Pnl w/rflx Tit/Patn (Completed)   CT Chest W Contrast   CT Abdomen Pelvis W Contrast      Follow up plan: Return in about 2 weeks (around 08/16/2016) for follow-uup.  An after-visit summary was printed and given to the patient at [place].  Please see the patient instructions which may contain other information and recommendations beyond what is mentioned above in the assessment and plan.  No orders of the defined types were placed in this encounter.   Orders Placed This Encounter    Procedures  . CT Chest W Contrast  . CT Abdomen Pelvis W Contrast  . Urinalysis w microscopic + reflex cultur  . PSA  . ANA,IFA RA Diag Pnl w/rflx Tit/Patn  . Lipase  . Amylase  . T4, free

## 2016-08-02 NOTE — Assessment & Plan Note (Addendum)
Will get CT scan chest, abd, pelv; discussed possibility of malignancy; need to work up with close f/u

## 2016-08-02 NOTE — Assessment & Plan Note (Addendum)
Check ANA, explained concern for malignancy; will get CT scan chest, abd, pelvis; doubt all due to poor dentition, though we discussed this as a causative factor in heart disease

## 2016-08-02 NOTE — Patient Instructions (Signed)
Please return for labs this afternoon Do contact the oral surgeon about your teeth Keep me posted about any changes in your health Return the stool cards at your earliest convenience I'll order a CT scan to look for anything worrisome

## 2016-08-03 LAB — URINALYSIS W MICROSCOPIC + REFLEX CULTURE
BILIRUBIN URINE: NEGATIVE
Bacteria, UA: NONE SEEN [HPF]
CASTS: NONE SEEN [LPF]
Glucose, UA: NEGATIVE
Leukocytes, UA: NEGATIVE
Nitrite: NEGATIVE
SPECIFIC GRAVITY, URINE: 1.027 (ref 1.001–1.035)
Yeast: NONE SEEN [HPF]
pH: 5 (ref 5.0–8.0)

## 2016-08-03 LAB — LIPASE: LIPASE: 31 U/L (ref 7–60)

## 2016-08-03 LAB — AMYLASE: Amylase: 52 U/L (ref 0–105)

## 2016-08-05 ENCOUNTER — Telehealth: Payer: Self-pay | Admitting: Family Medicine

## 2016-08-05 LAB — ANA,IFA RA DIAG PNL W/RFLX TIT/PATN: Anti Nuclear Antibody(ANA): NEGATIVE

## 2016-08-05 NOTE — Telephone Encounter (Signed)
Have appointment on 08/16/16 and rescheduled for 08/26/16. 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 He only have 2 fingers that he is able to use. He is going to try to do the sample later this week.

## 2016-08-07 ENCOUNTER — Encounter: Payer: Self-pay | Admitting: Family Medicine

## 2016-08-07 DIAGNOSIS — R319 Hematuria, unspecified: Secondary | ICD-10-CM | POA: Insufficient documentation

## 2016-08-07 DIAGNOSIS — Z125 Encounter for screening for malignant neoplasm of prostate: Secondary | ICD-10-CM | POA: Insufficient documentation

## 2016-08-12 ENCOUNTER — Telehealth: Payer: Self-pay

## 2016-08-12 NOTE — Telephone Encounter (Signed)
Patient called states he cannot do hemoccult stool cards right now states he cannot drive and barely can walk he will try to get them in as soon as he can.  Wanted me to let u know.

## 2016-08-12 NOTE — Telephone Encounter (Signed)
Patient states he will try to do it this week.

## 2016-08-12 NOTE — Telephone Encounter (Signed)
Check with Miel to see if he can mail this or have his son drop them off

## 2016-08-13 ENCOUNTER — Telehealth: Payer: Self-pay | Admitting: Family Medicine

## 2016-08-13 NOTE — Telephone Encounter (Signed)
Pt would like a call back

## 2016-08-16 ENCOUNTER — Ambulatory Visit: Payer: PPO | Admitting: Family Medicine

## 2016-08-26 ENCOUNTER — Encounter: Payer: Self-pay | Admitting: Family Medicine

## 2016-08-26 ENCOUNTER — Ambulatory Visit (INDEPENDENT_AMBULATORY_CARE_PROVIDER_SITE_OTHER): Payer: PPO | Admitting: Family Medicine

## 2016-08-26 DIAGNOSIS — R7982 Elevated C-reactive protein (CRP): Secondary | ICD-10-CM | POA: Diagnosis not present

## 2016-08-26 DIAGNOSIS — M25541 Pain in joints of right hand: Secondary | ICD-10-CM | POA: Insufficient documentation

## 2016-08-26 DIAGNOSIS — M25542 Pain in joints of left hand: Secondary | ICD-10-CM | POA: Diagnosis not present

## 2016-08-26 LAB — CBC WITH DIFFERENTIAL/PLATELET
BASOS ABS: 0 {cells}/uL (ref 0–200)
Basophils Relative: 0 %
EOS PCT: 4 %
Eosinophils Absolute: 396 cells/uL (ref 15–500)
HEMATOCRIT: 39.9 % (ref 38.5–50.0)
Hemoglobin: 13.2 g/dL (ref 13.2–17.1)
LYMPHS PCT: 17 %
Lymphs Abs: 1683 cells/uL (ref 850–3900)
MCH: 29.2 pg (ref 27.0–33.0)
MCHC: 33.1 g/dL (ref 32.0–36.0)
MCV: 88.3 fL (ref 80.0–100.0)
MONO ABS: 990 {cells}/uL — AB (ref 200–950)
MPV: 8.5 fL (ref 7.5–12.5)
Monocytes Relative: 10 %
NEUTROS PCT: 69 %
Neutro Abs: 6831 cells/uL (ref 1500–7800)
Platelets: 409 10*3/uL — ABNORMAL HIGH (ref 140–400)
RBC: 4.52 MIL/uL (ref 4.20–5.80)
RDW: 13.9 % (ref 11.0–15.0)
WBC: 9.9 10*3/uL (ref 3.8–10.8)

## 2016-08-26 MED ORDER — PREDNISONE 10 MG PO TABS: ORAL_TABLET | ORAL | 0 refills | Status: AC

## 2016-08-26 NOTE — Assessment & Plan Note (Addendum)
Recheck, check labs; discussed ddx with patient and his family members; inflammation, infection, cancer all in ddx

## 2016-08-26 NOTE — Patient Instructions (Addendum)
Start the prednisone Start Zantac 150 mg twice a day while on the prednisone to protect your stomach Avoid all non-steroidal anti-inflammatories while on the prednisone It is okay to take Tylenol for pain We'll have you see the rheumatologist

## 2016-08-26 NOTE — Progress Notes (Signed)
BP (!) 142/86   Pulse 96   Temp 98.1 F (36.7 C) (Oral)   Resp 14   Wt 156 lb (70.8 kg)   SpO2 94%   BMI 25.96 kg/m    Subjective:    Patient ID: Brett Floyd, male    DOB: 26-Dec-1933, 82 y.o.   MRN: [id number]  HPI: Brett Floyd is a 82 y.o. male  Chief Complaint  Patient presents with  . Joint Pain    unchanged   Patient is here with his wife and son He has not had the CT scan of his chest, abdomen, pelvis yet; he has not returned the stool cards yet He has had pain in his hands, real stiff, started in the left hand and then the right hand Started in the base of the thumb and up into the wrist and hand; he apologized for not letting me know earlier about the hand pain He has pain in the knees and has trouble getting up out of the chair if knees are bent 43 degrees Mother had bad varicose veins and rheumatoid arthritis Sleeping fine Gaining a little bit of weight Pain in the knee joints Bowels are fine; no blood in the stool He has bad teeth; needs to have them pulled, going to see oral surgeon in [place] No fevers He has not taken prednisone in the past He is not driving because of the pain  Depression screen [facility] 2/9 06/21/2016  Decreased Interest 0  Down, Depressed, Hopeless 0  PHQ - 2 Score 0   Relevant past medical, surgical, family and social history reviewed Past Medical History:  Diagnosis Date  . Hypertension    Past Surgical History:  Procedure Laterality Date  . HERNIA REPAIR    . MOUTH SURGERY     Family History  Problem Relation Age of Onset  . Cancer Mother     pancreatic cancer  . Hypertension Father   . Stroke Father   . Heart disease Father   . Hypertension Son    Social History  Substance Use Topics  . Smoking status: Never Smoker  . Smokeless tobacco: Never Used  . Alcohol use No   Interim medical history since last visit reviewed. Allergies and medications reviewed  Review of Systems Per HPI unless specifically  indicated above     Objective:    BP (!) 142/86   Pulse 96   Temp 98.1 F (36.7 C) (Oral)   Resp 14   Wt 156 lb (70.8 kg)   SpO2 94%   BMI 25.96 kg/m   Wt Readings from Last 3 Encounters:  08/26/16 156 lb (70.8 kg)  08/02/16 154 lb (69.9 kg)  07/25/16 158 lb (71.7 kg)    Physical Exam  Constitutional: He appears well-developed and well-nourished. No distress.  Weight down 4 pounds over last week  HENT:  Head: Normocephalic and atraumatic.  Mouth/Throat: Dental caries present.  Dentition is terrible repair; many teeth eroded down to the gum line  Eyes: EOM are normal. No scleral icterus.  Neck: No thyromegaly present.  Cardiovascular: Normal rate, regular rhythm and normal heart sounds.   No extrasystoles are present. Exam reveals no gallop.   No murmur heard. Pulmonary/Chest: Effort normal and breath sounds normal. He has no wheezes.  Abdominal: Soft. Bowel sounds are normal. He exhibits no distension. There is no tenderness.  Musculoskeletal: He exhibits no edema.       Right wrist: He exhibits no deformity.  Left wrist: He exhibits no deformity.       Right knee: Tenderness found.       Left knee: Tenderness found.       Right hand: He exhibits tenderness. He exhibits no deformity.       Left hand: He exhibits tenderness. He exhibits no deformity.  Tenderness over the base of the thumbs, 2nd MCP joint; no ulnar deviation of the fingers; tenderness over both medial knees  Neurological: He is alert. He displays no tremor.  Reflex Scores:      Patellar reflexes are 1+ on the right side and 1+ on the left side. Skin: Skin is warm and dry. No pallor.  Psychiatric: He has a normal mood and affect. His behavior is normal. Judgment and thought content normal. His mood appears not anxious. Cognition and memory are not impaired. He does not exhibit a depressed mood.      Assessment & Plan:   Problem List Items Addressed This Visit      Other   CRP elevated     Recheck, check labs; discussed ddx with patient and his family members; inflammation, infection, cancer all in ddx      Relevant Orders   C-reactive protein (Completed)   Rheumatoid Factor (Completed)   Ambulatory referral to Rheumatology   Antinuclear Antib (ANA) (Completed)   CBC with Differential/Platelet (Completed)   Cyclic citrul peptide antibody, IgG (Completed)   Arthralgia of both hands    Check labs; this heightens suspicion for RA, and I'll check labs and refer to rheumatologist; start short round of prednisone while awaiting labs to check response and see if he gets some relief; on steroid, will have him use H2 blocker to help prevent steroid-induced ulcer      Relevant Orders   C-reactive protein (Completed)   Rheumatoid Factor (Completed)   Ambulatory referral to Rheumatology   Antinuclear Antib (ANA) (Completed)   CBC with Differential/Platelet (Completed)   Cyclic citrul peptide antibody, IgG (Completed)      Follow up plan: No Follow-up on file.  An after-visit summary was printed and given to the patient at [place].  Please see the patient instructions which may contain other information and recommendations beyond what is mentioned above in the assessment and plan.  Meds ordered this encounter  Medications  . predniSONE (DELTASONE) 10 MG tablet    Sig: Three pills by mouth Monday and Tuesday, then two pills on Wednesday and Thursday, then one pill Friday and Saturday    Dispense:  12 tablet    Refill:  0    Orders Placed This Encounter  Procedures  . C-reactive protein  . Rheumatoid Factor  . Antinuclear Antib (ANA)  . CBC with Differential/Platelet  . Cyclic citrul peptide antibody, IgG  . Ambulatory referral to Rheumatology

## 2016-08-26 NOTE — Assessment & Plan Note (Addendum)
Check labs; this heightens suspicion for RA, and I'll check labs and refer to rheumatologist; start short round of prednisone while awaiting labs to check response and see if he gets some relief; on steroid, will have him use H2 blocker to help prevent steroid-induced ulcer

## 2016-08-27 ENCOUNTER — Other Ambulatory Visit: Payer: Self-pay

## 2016-08-27 DIAGNOSIS — M109 Gout, unspecified: Secondary | ICD-10-CM

## 2016-08-27 LAB — C-REACTIVE PROTEIN: CRP: 58.9 mg/L — AB (ref <8.0)

## 2016-08-27 LAB — RHEUMATOID FACTOR

## 2016-08-27 LAB — CYCLIC CITRUL PEPTIDE ANTIBODY, IGG: Cyclic Citrullin Peptide Ab: <16 Units

## 2016-08-27 LAB — ANA: ANA: NEGATIVE

## 2016-09-09 ENCOUNTER — Ambulatory Visit: Payer: PPO

## 2016-09-18 ENCOUNTER — Telehealth: Payer: Self-pay

## 2016-09-18 MED ORDER — PREDNISONE 10 MG PO TABS: ORAL_TABLET | ORAL | 0 refills | Status: DC

## 2016-09-18 MED ORDER — OMEPRAZOLE 20 MG PO CPDR: 20.0000 mg | DELAYED_RELEASE_CAPSULE | Freq: Every day | ORAL | 0 refills | Status: DC

## 2016-09-18 NOTE — Telephone Encounter (Signed)
Patient wife called and she is saying that the patient is still having problems with his leg's. He is unable to walk, move and support himself, she did mention he can move in small steps but no to his full capacity. Patient wife stated that 4am this morning he couldn't even use the restroom he stayed on the floor in the bathroom until someone was called to help him up. She couldn't help him up by her self.  Patient has taken OVC meds but its not working . Patient had to reschedule his CT scan as well. Patient wife is wondering would another round of prednisone would help him get some use of his legs?

## 2016-09-18 NOTE — Telephone Encounter (Signed)
I called patient's number; reached wife who sounded like she didn't know who I was, was ordering a steak biscuit (sounded like she was at a drive thru window); bad / slow connection, she didn't who I was and then apparently couldn't hear me any more, so I hung up

## 2016-09-18 NOTE — Telephone Encounter (Signed)
I spoke with patient's wife She says he cannot put any weight on his legs I explained that I'm really concerned, because he has not seen rheumatologist or had his scans; ddx includes serious inflammatory disease, spinal cord tumor, rheumatoid arthritis, cancer, neurologic disease, other rheumatologic disease I spoke with patient as well; I am very concerned; no back pain whatsoever; no problems voiding or bowel movements; no pain in the shoulders; trying to stand is the problem, weak in the legs, knees down Do not stop abruptly because it can cause adrenal failure; very important to take as directed I asked about rheumatologist and urged them to reschedule He has not done the stool cards or the scans or seen the rheumatologist yet; I really want to help him but need some help Start prednisone and omeprazole Go to ER if worse; he agrees

## 2016-09-25 ENCOUNTER — Ambulatory Visit: Payer: PPO

## 2016-10-17 ENCOUNTER — Emergency Department
Admission: EM | Admit: 2016-10-17 | Discharge: 2016-10-17 | Disposition: A | Discharge status: Home / Self Care | Payer: PPO | Attending: Emergency Medicine | Admitting: Emergency Medicine

## 2016-10-17 ENCOUNTER — Encounter: Payer: Self-pay | Admitting: Emergency Medicine

## 2016-10-17 DIAGNOSIS — I10 Essential (primary) hypertension: Secondary | ICD-10-CM | POA: Insufficient documentation

## 2016-10-17 DIAGNOSIS — R040 Epistaxis: Secondary | ICD-10-CM

## 2016-10-17 LAB — CBC
HCT: 40.9 % (ref 40.0–52.0)
Hemoglobin: 14 g/dL (ref 13.0–18.0)
MCH: 30.1 pg (ref 26.0–34.0)
MCHC: 34.2 g/dL (ref 32.0–36.0)
MCV: 88 fL (ref 80.0–100.0)
PLATELETS: 398 10*3/uL (ref 150–440)
RBC: 4.65 MIL/uL (ref 4.40–5.90)
RDW: 16.1 % — AB (ref 11.5–14.5)
WBC: 11.5 10*3/uL — AB (ref 3.8–10.6)

## 2016-10-17 MED ORDER — PHENYLEPHRINE HCL 0.5 % NA SOLN
2.0000 [drp] | Freq: Once | NASAL | Status: AC
  Administered 2016-10-17: 2 [drp] via NASAL
  Filled 2016-10-17: qty 15

## 2016-10-17 NOTE — ED Triage Notes (Addendum)
Pt c/o nose started bleeding yesterday and has slowed down but has not stopped. Is not on blood thinners. Pt has tissue stuck in nose, unable to visualize.  Pt reports has not lost lot of blood.

## 2016-10-17 NOTE — ED Notes (Signed)
Patient c/o nosebleed beginning at 0700 this am. Patient has small amount of drainage on tissue that's in nose (tissue has been in place for 1 hour).

## 2016-10-17 NOTE — ED Notes (Signed)
Patient and spouse out to the waiting room. RN attempted to call taxi service for pt/family. Taxi service not answering. RN asked patient if they had family/friend to call for ride. Pt reported he had a son that lives in [place], but declined to call son, or allow RN to call son. Pt believes it's too far for son to drive with the weather conditions. Pt reports he will wait in the lobby until morning.

## 2016-10-17 NOTE — Discharge Instructions (Signed)
Follow up with the ENT doctor if your nosebleed returns. Return to the emergency department for symptoms that change or worsen if you are unable to see your primary care provider or the ear nose and throat doctor. Your blood pressure is high today, and you will need to follow up with your primary care provider for further management. Blood pressure being elevated may cause your nose to bleed.

## 2016-10-22 ENCOUNTER — Telehealth: Payer: Self-pay

## 2016-10-22 NOTE — Telephone Encounter (Signed)
Reviewed chart briefly I don't see that he's had his scans I don't see that he's seen the rheumatologist Please have him schedule an appointment He was not supposed to just stop the prednisone cold [place]; that could be dangerous and cause adrenal failure I really need to see him and have him see specialist and get scans please

## 2016-10-22 NOTE — Telephone Encounter (Signed)
Left detailed voicemail for patient to call back

## 2016-10-22 NOTE — Telephone Encounter (Signed)
Patient wife called states husband still having joint pain tried taking prednisone and had side effects like memory loss and severe diarrhea.  He has since stopped, but wants to see if there is something else he can take instead?

## 2016-10-23 NOTE — ED Provider Notes (Signed)
[facility] Emergency Department Provider Note  ____________________________________________   First MD Initiated Contact with Patient 10/17/16 2008     (approximate)  I have reviewed the triage vital signs and the nursing notes.   HISTORY  Chief Complaint Epistaxis   HPI Brett Floyd is a 83 y.o. male who presents to the emergency department for evaluation of epistaxis. He has a history of the same. He states bleeding started last night and has continued to "ooze" throughout the day and evening despite holding pressure and stuffing toilet paper up the left side. He states that his PCP told him to stop his blood pressure medicine because he "didn't need it anymore." He also states he believes his nose is bleeding because of the dry air in his house caused by running the heater.He also states that he and his wife have no food in the house because their refrigerator doesn't work.    Past Medical History:  Diagnosis Date  . Hypertension     Patient Active Problem List   Diagnosis Date Noted  . Arthralgia of both hands 08/26/2016  . Prostate cancer screening 08/07/2016  . Blood in urine 08/07/2016  . Varicose veins of both lower extremities with pain 07/25/2016  . CRP elevated 07/25/2016  . Essential hypertension, benign 06/21/2016  . Hypokalemia 06/21/2016  . Elevated white blood cell count 06/21/2016  . Hypocalcemia 06/21/2016  . LVH (left ventricular hypertrophy) 06/21/2016    Past Surgical History:  Procedure Laterality Date  . HERNIA REPAIR    . MOUTH SURGERY      Prior to Admission medications   Medication Sig Start Date End Date Taking? Authorizing Provider  aspirin EC 81 MG tablet Take 1 tablet (81 mg total) by mouth daily. 06/21/16   Arnetha Courser, MD  omeprazole (PRILOSEC) 20 MG capsule Take 1 capsule (20 mg total) by mouth daily. 09/18/16   Arnetha Courser, MD  predniSONE (DELTASONE) 10 MG tablet Three pills by mouth daily x 4  days, then two pills daily x 4 days, then one pill a day until otherwise instructed; do NOT stop abruptly 09/18/16   Arnetha Courser, MD    Allergies Erythromycin  Family History  Problem Relation Age of Onset  . Cancer Mother     pancreatic cancer  . Hypertension Father   . Stroke Father   . Heart disease Father   . Hypertension Son     Social History Social History  Substance Use Topics  . Smoking status: Never Smoker  . Smokeless tobacco: Never Used  . Alcohol use No    Review of Systems Constitutional: No fever/chills Eyes: No visual changes. ENT: No sore throat. Positive for epistaxis from the left nostril. Cardiovascular: Denies chest pain. Respiratory: Denies shortness of breath. Gastrointestinal: No abdominal pain.  No nausea, no vomiting.  No diarrhea. Musculoskeletal: Negative for back pain. Skin: Negative for rash. Neurological: Negative for headaches, focal weakness or numbness.  ____________________________________________   PHYSICAL EXAM:  VITAL SIGNS: ED Triage Vitals  Enc Vitals Group     BP 10/17/16 1801 (!) 156/110     Pulse Rate 10/17/16 1801 (!) 115     Resp 10/17/16 1801 15     Temp 10/17/16 1801 97.8 F (36.6 C)     Temp Source 10/17/16 1801 Oral     SpO2 10/17/16 1801 95 %     Weight 10/17/16 1803 150 lb (68 kg)     Height 10/17/16 1803 5\' 7"  (  1.702 m)     Head Circumference --      Peak Flow --      Pain Score --      Pain Loc --      Pain Edu? --      Excl. in [place]? --     Constitutional: Alert and oriented. Emaciated in appearance. Eyes: Conjunctivae are normal. PERRL. EOMI. Head: Atraumatic. Nose: Scant amount of dark blood noted on toilet paper that was removed from the left nostril. No active bleeding noted.  Mouth/Throat: Mucous membranes are moist.  Oropharynx non-erythematous. Neck: No stridor.   Cardiovascular: Normal rate, regular rhythm. Grossly normal heart sounds.  Good peripheral circulation. Respiratory: Normal  respiratory effort.  No retractions. Lungs CTAB. Gastrointestinal: Soft and nontender. No distention. No abdominal bruits. No CVA tenderness. Musculoskeletal: No lower extremity tenderness nor edema.  No joint effusions. Neurologic:  Normal speech and language. No gross focal neurologic deficits are appreciated. No gait instability. Skin:  Skin is warm, dry and intact. No rash noted. Psychiatric: Mood and affect are normal. Speech and behavior are normal.  ____________________________________________   LABS (all labs ordered are listed, but only abnormal results are displayed)  Labs Reviewed  CBC - Abnormal; Notable for the following:       Result Value   WBC 11.5 (*)    RDW 16.1 (*)    All other components within normal limits   ____________________________________________  EKG   ____________________________________________  RADIOLOGY  Not indicated. ____________________________________________   PROCEDURES  Procedure(s) performed: None  Procedures  Critical Care performed: No  ____________________________________________   INITIAL IMPRESSION / ASSESSMENT AND PLAN / ED COURSE  Pertinent labs & imaging results that were available during my care of the patient were reviewed by me and considered in my medical decision making (see chart for details).  83 year old male presenting to the emergency department for evaluation of epistaxis that has stopped. 2 sprays of Neo-Synephrine were instilled into the left nostril. Due to potentially poor living conditions, social work consult was requested. Patient and his wife were provided meals while here in the emergency department. He was advised that his blood pressure has been elevated this evening and was encouraged to call and speak with his primary care provider. He was advised that this may be a leading cause of the nosebleed. He was instructed to return to the emergency department for any symptom that changes or worsens if he  is unable to schedule an appointment with primary care.      ____________________________________________   FINAL CLINICAL IMPRESSION(S) / ED DIAGNOSES  Final diagnoses:  Left-sided epistaxis      NEW MEDICATIONS STARTED DURING THIS VISIT:  Discharge Medication List as of 10/17/2016  8:57 PM       Note:  This document was prepared using Dragon voice recognition software and may include unintentional dictation errors.    Victorino Dike, FNP 10/23/16 [place] Quigley, MD 10/25/16 [phone]

## 2016-10-28 ENCOUNTER — Ambulatory Visit (INDEPENDENT_AMBULATORY_CARE_PROVIDER_SITE_OTHER): Payer: PPO

## 2016-10-28 DIAGNOSIS — Z23 Encounter for immunization: Secondary | ICD-10-CM

## 2016-10-28 DIAGNOSIS — M1990 Unspecified osteoarthritis, unspecified site: Secondary | ICD-10-CM | POA: Diagnosis not present

## 2016-10-28 DIAGNOSIS — M170 Bilateral primary osteoarthritis of knee: Secondary | ICD-10-CM | POA: Diagnosis not present

## 2016-10-28 DIAGNOSIS — R7982 Elevated C-reactive protein (CRP): Secondary | ICD-10-CM | POA: Diagnosis not present

## 2016-10-28 DIAGNOSIS — R700 Elevated erythrocyte sedimentation rate: Secondary | ICD-10-CM | POA: Diagnosis not present

## 2016-11-26 ENCOUNTER — Emergency Department: Payer: PPO

## 2016-11-26 ENCOUNTER — Emergency Department
Admission: EM | Admit: 2016-11-26 | Discharge: 2016-11-27 | Disposition: A | Discharge status: Home / Self Care | Payer: PPO | Attending: Emergency Medicine | Admitting: Emergency Medicine

## 2016-11-26 ENCOUNTER — Encounter: Payer: Self-pay | Admitting: Emergency Medicine

## 2016-11-26 DIAGNOSIS — S51019A Laceration without foreign body of unspecified elbow, initial encounter: Secondary | ICD-10-CM

## 2016-11-26 DIAGNOSIS — I10 Essential (primary) hypertension: Secondary | ICD-10-CM | POA: Diagnosis not present

## 2016-11-26 DIAGNOSIS — R51 Headache: Secondary | ICD-10-CM | POA: Diagnosis not present

## 2016-11-26 DIAGNOSIS — S51011A Laceration without foreign body of right elbow, initial encounter: Secondary | ICD-10-CM | POA: Diagnosis not present

## 2016-11-26 DIAGNOSIS — S51012A Laceration without foreign body of left elbow, initial encounter: Secondary | ICD-10-CM | POA: Diagnosis not present

## 2016-11-26 DIAGNOSIS — Y999 Unspecified external cause status: Secondary | ICD-10-CM | POA: Diagnosis not present

## 2016-11-26 DIAGNOSIS — Z79899 Other long term (current) drug therapy: Secondary | ICD-10-CM | POA: Diagnosis not present

## 2016-11-26 DIAGNOSIS — S59902A Unspecified injury of left elbow, initial encounter: Secondary | ICD-10-CM | POA: Diagnosis not present

## 2016-11-26 DIAGNOSIS — S0003XA Contusion of scalp, initial encounter: Secondary | ICD-10-CM | POA: Diagnosis not present

## 2016-11-26 DIAGNOSIS — S0990XA Unspecified injury of head, initial encounter: Secondary | ICD-10-CM | POA: Insufficient documentation

## 2016-11-26 DIAGNOSIS — Y929 Unspecified place or not applicable: Secondary | ICD-10-CM | POA: Insufficient documentation

## 2016-11-26 DIAGNOSIS — Z7982 Long term (current) use of aspirin: Secondary | ICD-10-CM | POA: Diagnosis not present

## 2016-11-26 DIAGNOSIS — Y9389 Activity, other specified: Secondary | ICD-10-CM | POA: Insufficient documentation

## 2016-11-26 DIAGNOSIS — S199XXA Unspecified injury of neck, initial encounter: Secondary | ICD-10-CM | POA: Diagnosis not present

## 2016-11-26 DIAGNOSIS — W1839XA Other fall on same level, initial encounter: Secondary | ICD-10-CM | POA: Diagnosis not present

## 2016-11-26 NOTE — ED Triage Notes (Signed)
Pt to triage via w/c with no distress noted; st while at [facility] lost his balance hitting back of head on concrete; denies LOC; st has HA but was present before fall; no cervical tenderness with palpation

## 2016-11-26 NOTE — ED Notes (Signed)
Pt fell outside of [facility] after loosing his balance, hit back of head on cement floor.  No loc, lac noted with bleeding controlled.

## 2016-11-26 NOTE — ED Provider Notes (Signed)
[facility] Emergency Department Provider Note   ____________________________________________   First MD Initiated Contact with Patient 11/26/16 2345     (approximate)  I have reviewed the triage vital signs and the nursing notes.   HISTORY  Chief Complaint Head Injury    HPI Brett Floyd is a 83 y.o. male who [place] the ED from restaurant with a chief complaint of mechanical fall with head injury. Patient reports stumbling on the curb while opening the door to his car and falling first on his buttocks and then on his left head. Incident occurred approximately 9:30 PM. Denies LOC. Denies associated dizziness, vision changes, nausea or vomiting. Tetanus is up-to-date. Denies recent fever, chills, chest pain, shortness of breath, abdominal pain, diarrhea. Nothing makes his symptoms better or worse.  Past Medical History:  Diagnosis Date  . Hypertension     Patient Active Problem List   Diagnosis Date Noted  . Arthralgia of both hands 08/26/2016  . Prostate cancer screening 08/07/2016  . Blood in urine 08/07/2016  . Varicose veins of both lower extremities with pain 07/25/2016  . CRP elevated 07/25/2016  . Essential hypertension, benign 06/21/2016  . Hypokalemia 06/21/2016  . Elevated white blood cell count 06/21/2016  . Hypocalcemia 06/21/2016  . LVH (left ventricular hypertrophy) 06/21/2016    Past Surgical History:  Procedure Laterality Date  . HERNIA REPAIR    . MOUTH SURGERY      Prior to Admission medications   Medication Sig Start Date End Date Taking? Authorizing Provider  aspirin EC 81 MG tablet Take 1 tablet (81 mg total) by mouth daily. 06/21/16   Arnetha Courser, MD  omeprazole (PRILOSEC) 20 MG capsule Take 1 capsule (20 mg total) by mouth daily. 09/18/16   Arnetha Courser, MD  ondansetron (ZOFRAN ODT) 4 MG disintegrating tablet Take 1 tablet (4 mg total) by mouth every 8 (eight) hours as needed for nausea or vomiting.  11/27/16   Paulette Blanch, MD  predniSONE (DELTASONE) 10 MG tablet Three pills by mouth daily x 4 days, then two pills daily x 4 days, then one pill a day until otherwise instructed; do NOT stop abruptly 09/18/16   Arnetha Courser, MD    Allergies Erythromycin  Family History  Problem Relation Age of Onset  . Cancer Mother     pancreatic cancer  . Hypertension Father   . Stroke Father   . Heart disease Father   . Hypertension Son     Social History Social History  Substance Use Topics  . Smoking status: Never Smoker  . Smokeless tobacco: Never Used  . Alcohol use No    Review of Systems  Constitutional: No fever/chills. Eyes: No visual changes. ENT: No sore throat. Cardiovascular: Denies chest pain. Respiratory: Denies shortness of breath. Gastrointestinal: No abdominal pain.  No nausea, no vomiting.  No diarrhea.  No constipation. Genitourinary: Negative for dysuria. Musculoskeletal: Negative for back pain. Skin: Positive for skin tears. Negative for rash. Neurological: Positive for minor head injury. Negative for headaches, focal weakness or numbness.  10-point ROS otherwise negative.  ____________________________________________   PHYSICAL EXAM:  VITAL SIGNS: ED Triage Vitals [11/26/16 2109]  Enc Vitals Group     BP (!) 156/113     Pulse Rate (!) 102     Resp 20     Temp 97.6 F (36.4 C)     Temp Source Oral     SpO2 97 %     Weight  145 lb (65.8 kg)     Height 5\' 7"  (1.702 m)     Head Circumference      Peak Flow      Pain Score 3     Pain Loc      Pain Edu?      Excl. in [place]?     Constitutional: Alert and oriented. Well appearing and in no acute distress. Eyes: Conjunctivae are normal. PERRL. EOMI. Head: Small left parietal hematoma. No lacerations or active bleeding. Nose: No congestion/rhinnorhea. Mouth/Throat: Mucous membranes are moist.  Oropharynx non-erythematous. Neck: No stridor.  No cervical spine tenderness to palpation. Cardiovascular:  Normal rate, regular rhythm. Grossly normal heart sounds.  Good peripheral circulation. Respiratory: Normal respiratory effort.  No retractions. Lungs CTAB. Gastrointestinal: Soft and nontender. No distention. No abdominal bruits. No CVA tenderness. Musculoskeletal: Small skin tears to both elbows which are not bleeding. Elbows are nontender with full range of motion bilaterally. No lower extremity tenderness nor edema.  No joint effusions. Stable pelvis. Neurologic:  Normal speech and language. No gross focal neurologic deficits are appreciated. No gait instability. Skin:  Skin is warm, dry and intact. No rash noted. Psychiatric: Mood and affect are normal. Speech and behavior are normal.  ____________________________________________   LABS (all labs ordered are listed, but only abnormal results are displayed)  Labs Reviewed - No data to display ____________________________________________  EKG  none ____________________________________________  RADIOLOGY  CT head and C-spine interpreted per Dr. Dorann Lodge: CT HEAD: No acute intracranial process. Small LEFT posterior scalp  hematoma. No skull fracture.    Moderate to severe global brain atrophy.    Moderate chronic small vessel ischemic disease and old appearing  lacunar infarcts.    CT CERVICAL SPINE: No acute fracture or malalignment.   ____________________________________________   PROCEDURES  Procedure(s) performed: None  Procedures  Critical Care performed: No  ____________________________________________   INITIAL IMPRESSION / ASSESSMENT AND PLAN / ED COURSE  Pertinent labs & imaging results that were available during my care of the patient were reviewed by me and considered in my medical decision making (see chart for details).  83 year old male who presents status post mechanical fall with minor head injury and bilateral skin tears. CT head and cervical spine are negative for acute traumatic injuries.  Tylenol administered for discomfort. Blood pressure elevated; patient to take his nighttime medications when he gets home. Strict return precautions given. Patient and spouse verbalize understanding and agree with plan of care.    ____________________________________________   FINAL CLINICAL IMPRESSION(S) / ED DIAGNOSES  Final diagnoses:  Minor head injury, initial encounter  Skin tear of elbow without complication, initial encounter  Contusion of scalp, initial encounter      NEW MEDICATIONS STARTED DURING THIS VISIT:  New Prescriptions   ONDANSETRON (ZOFRAN ODT) 4 MG DISINTEGRATING TABLET    Take 1 tablet (4 mg total) by mouth every 8 (eight) hours as needed for nausea or vomiting.     Note:  This document was prepared using Dragon voice recognition software and may include unintentional dictation errors.    Paulette Blanch, MD 11/27/16 [phone]

## 2016-11-27 MED ORDER — ONDANSETRON 4 MG PO TBDP: 4.0000 mg | ORAL_TABLET | Freq: Three times a day (TID) | ORAL | 0 refills | Status: DC | PRN

## 2016-11-27 MED ORDER — ACETAMINOPHEN 325 MG PO TABS
650.0000 mg | ORAL_TABLET | Freq: Once | ORAL | Status: AC
  Administered 2016-11-27: 650 mg via ORAL
  Filled 2016-11-27: qty 2

## 2016-11-27 NOTE — Discharge Instructions (Signed)
1. You may take tylenol as needed for pain.  2. Take zofran as needed for nausea. 3. Return to the ER for worsening symptoms, persistent vomiting, lethargy or other concerns.

## 2017-08-08 ENCOUNTER — Ambulatory Visit: Payer: PPO | Admitting: Family Medicine

## 2017-08-14 ENCOUNTER — Encounter: Payer: Self-pay | Admitting: Family Medicine

## 2017-08-14 ENCOUNTER — Ambulatory Visit: Payer: PPO | Admitting: Family Medicine

## 2017-08-14 VITALS — BP 132/84 | HR 94 | Temp 97.7°F | Wt 170.0 lb

## 2017-08-14 DIAGNOSIS — I639 Cerebral infarction, unspecified: Secondary | ICD-10-CM | POA: Diagnosis not present

## 2017-08-14 DIAGNOSIS — R3129 Other microscopic hematuria: Secondary | ICD-10-CM

## 2017-08-14 DIAGNOSIS — D72828 Other elevated white blood cell count: Secondary | ICD-10-CM

## 2017-08-14 DIAGNOSIS — G319 Degenerative disease of nervous system, unspecified: Secondary | ICD-10-CM | POA: Diagnosis not present

## 2017-08-14 DIAGNOSIS — R413 Other amnesia: Secondary | ICD-10-CM | POA: Diagnosis not present

## 2017-08-14 DIAGNOSIS — I10 Essential (primary) hypertension: Secondary | ICD-10-CM | POA: Diagnosis not present

## 2017-08-14 DIAGNOSIS — I6381 Other cerebral infarction due to occlusion or stenosis of small artery: Secondary | ICD-10-CM

## 2017-08-14 DIAGNOSIS — Z23 Encounter for immunization: Secondary | ICD-10-CM | POA: Diagnosis not present

## 2017-08-14 DIAGNOSIS — I679 Cerebrovascular disease, unspecified: Secondary | ICD-10-CM

## 2017-08-14 DIAGNOSIS — G911 Obstructive hydrocephalus: Secondary | ICD-10-CM

## 2017-08-14 DIAGNOSIS — I83813 Varicose veins of bilateral lower extremities with pain: Secondary | ICD-10-CM

## 2017-08-14 DIAGNOSIS — Z0289 Encounter for other administrative examinations: Secondary | ICD-10-CM | POA: Insufficient documentation

## 2017-08-14 DIAGNOSIS — L989 Disorder of the skin and subcutaneous tissue, unspecified: Secondary | ICD-10-CM | POA: Diagnosis not present

## 2017-08-14 LAB — CBC WITH DIFFERENTIAL/PLATELET
BASOS ABS: 129 {cells}/uL (ref 0–200)
Basophils Relative: 1.5 %
EOS ABS: 516 {cells}/uL — AB (ref 15–500)
Eosinophils Relative: 6 %
HCT: 46.5 % (ref 38.5–50.0)
HEMOGLOBIN: 15.9 g/dL (ref 13.2–17.1)
Lymphs Abs: 1797 cells/uL (ref 850–3900)
MCH: 31.4 pg (ref 27.0–33.0)
MCHC: 34.2 g/dL (ref 32.0–36.0)
MCV: 91.7 fL (ref 80.0–100.0)
MONOS PCT: 8.9 %
MPV: 9.5 fL (ref 7.5–12.5)
Neutro Abs: 5392 cells/uL (ref 1500–7800)
Neutrophils Relative %: 62.7 %
Platelets: 263 10*3/uL (ref 140–400)
RBC: 5.07 10*6/uL (ref 4.20–5.80)
RDW: 13 % (ref 11.0–15.0)
Total Lymphocyte: 20.9 %
WBC: 8.6 10*3/uL (ref 3.8–10.8)
WBCMIX: 765 {cells}/uL (ref 200–950)

## 2017-08-14 MED ORDER — TRIAMCINOLONE ACETONIDE 0.1 % EX CREA: 1.0000 "application " | TOPICAL_CREAM | Freq: Two times a day (BID) | CUTANEOUS | 0 refills | Status: AC

## 2017-08-14 NOTE — Assessment & Plan Note (Signed)
Check vit D 

## 2017-08-14 NOTE — Assessment & Plan Note (Signed)
Refer to neurologist 

## 2017-08-14 NOTE — Assessment & Plan Note (Signed)
Compression stockings; will discuss further at next appt; I don't think this represents peripheral vascular disease at this time and will not order ABI studies

## 2017-08-14 NOTE — Assessment & Plan Note (Signed)
Controlled on no medicines; try to DASH guidelines

## 2017-08-14 NOTE — Patient Instructions (Addendum)
Let's get labs today Take deep breaths 10x an hour while awake, not two in a row and not all at once; just one at a time Let's get labs today I'll see you back together

## 2017-08-14 NOTE — Assessment & Plan Note (Addendum)
Noted on CT scan; daily aspirin; check lipid

## 2017-08-14 NOTE — Assessment & Plan Note (Signed)
Refer to neuro 

## 2017-08-14 NOTE — Assessment & Plan Note (Signed)
Recheck urine today; he never had the CT scan

## 2017-08-14 NOTE — Assessment & Plan Note (Signed)
Noted on CT scan back in February 2018 when he fell; reviewed findings with patient and wife and son; NPH is in the differential; refer to neurologist

## 2017-08-14 NOTE — Assessment & Plan Note (Addendum)
Noted on the CT scan done in the ER in February 2018; discussed with patient as well as his wife and son; refer to neurologist; check lipid panel

## 2017-08-14 NOTE — Assessment & Plan Note (Addendum)
Noted in Feb 2018; check labs

## 2017-08-14 NOTE — Assessment & Plan Note (Signed)
Completed the [place]-2 form for patient; son asked for that to be sent to his office and he will start the process of finding a facility

## 2017-08-14 NOTE — Progress Notes (Signed)
BP 132/84 (BP Location: Left Arm, Patient Position: Sitting, Cuff Size: Normal)   Pulse 94   Temp 97.7 F (36.5 C) (Oral)   Wt 170 lb (77.1 kg)   SpO2 92%   BMI 26.63 kg/m    Subjective:    Patient ID: Brett Floyd, male    DOB: 29-Oct-1933, 83 y.o.   MRN: [id number]  HPI: Brett Floyd is a 83 y.o. male  Chief Complaint  Patient presents with  . Memory Loss    Wife and son have noticed a major change in short term memory  . Arthritis    Concerned about RA   . Anorexia    Not eating well, drinking lots of soda, going through a 2liter every 2 days, not drinking any water    HPI Patient is here with his wife and son The son is here with them and wants to help get them into an assisted living facility; they live together, but wife not really able to cook; just carbs and simple foods at this time; son is looking into meals on wheels Son lives in [place] and is hoping to get them in somewhere there We talked about his ER visit in February 2018; note and scan reviewed He fell backwards helping his wife get in the car; went to the hospital; no pain; they ran xrays and scans, all kinds of stuff; he was lucky, no damage he says  Nov 26, 2016 CT HEAD FINDINGS  BRAIN: Moderate to severe ventriculomegaly on the basis of global parenchymal brain volume loss. No intraparenchymal hemorrhage, mass effect nor midline shift. Patchy supratentorial white matter hypodensities within normal range for patient's age, though non-specific are most compatible with chronic small vessel ischemic disease. Old basal ganglia lacunar infarcts versus prominent perivascular spaces. Bilateral suspected old thalami lacunar infarct. No acute large vascular territory infarcts. No abnormal extra-axial fluid collections. Basal cisterns are patent.  VASCULAR: Mild calcific atherosclerosis of the carotid siphons.  SKULL: No skull fracture. Small LEFT parietal scalp hematoma  without subcutaneous gas or radiopaque foreign bodies.  SINUSES/ORBITS: Mild paranasal sinus mucosal thickening without air-fluid levels. Visualized mastoid air cells are well aerated. Old LEFT medial orbital blowout fracture. The included ocular globes and orbital contents are non-suspicious.  OTHER: Patient is edentulous.  CT CERVICAL SPINE FINDINGS  ALIGNMENT: Maintained lordosis. Vertebral bodies in alignment.  SKULL BASE AND VERTEBRAE: Cervical vertebral bodies and posterior elements are intact. Multilevel facet fusion on degenerative basis. Multilevel moderate to severe facet arthropathy.Intervertebral disc heights preserved, mild intradiscal calcifications. No destructive bony lesions. Osteopenia. C1-2 articulation maintained, moderate arthropathy.  SOFT TISSUES AND SPINAL CANAL: Normal.  DISC LEVELS: No significant osseous canal stenosis. Moderate LEFT C3-4 neural foraminal narrowing.  UPPER CHEST: Lung apices are clear.  OTHER: None.  IMPRESSION: CT HEAD: No acute intracranial process. Small LEFT posterior scalp hematoma. No skull fracture.  Moderate to severe global brain atrophy.  Moderate chronic small vessel ischemic disease and old appearing lacunar infarcts.  CT CERVICAL SPINE: No acute fracture or malalignment.   Electronically Signed   By: Elon Alas M.D.   On: 11/26/2016 22:19  We reviewed all of this together  He does have have loss of control of bowel and bladder  Son has noticed feet look purple; going on 6-12 months; feet do not feel cold, but he stays cold all the time in the house; no fam hx of vascular stents, but mother had terrible varicose veins  He has a place  on the left arm; itchy, started to thicken  He had hematuria on previous urine test; he never went for his CT scan He also never went for his echo; son asked if his lungs sound clear  See the FL-2 form: urine incontinence and occasional stool  incontinence if he cannot get to the bathroom on time; he thinks this is a time issue, not a sphincter issue; he is not disoriented per patient, wife, son; he does not drive any more, as he gave that up voluntarily; ambulatory without assistance  Depression screen [facility] 2/9 08/14/2017 06/21/2016  Decreased Interest 0 0  Down, Depressed, Hopeless 0 0  PHQ - 2 Score 0 0    Relevant past medical, surgical, family and social history reviewed Past Medical History:  Diagnosis Date  . Hypertension    Past Surgical History:  Procedure Laterality Date  . HERNIA REPAIR    . MOUTH SURGERY     Family History  Problem Relation Age of Onset  . Cancer Mother        pancreatic cancer  . Hypertension Father   . Stroke Father   . Heart disease Father   . Hypertension Son    Social History   Socioeconomic History  . Marital status: Married    Spouse name: Not on file  . Number of children: Not on file  . Years of education: Not on file  . Highest education level: Not on file  Social Needs  . Financial resource strain: Not on file  . Food insecurity - worry: Not on file  . Food insecurity - inability: Not on file  . Transportation needs - medical: Not on file  . Transportation needs - non-medical: Not on file  Occupational History  . Not on file  Tobacco Use  . Smoking status: Never Smoker  . Smokeless tobacco: Never Used  Substance and Sexual Activity  . Alcohol use: No  . Drug use: No  . Sexual activity: Not Currently  Other Topics Concern  . Not on file  Social History Narrative  . Not on file    Interim medical history since last visit reviewed. Allergies and medications reviewed  Review of Systems Per HPI unless specifically indicated above     Objective:    BP 132/84 (BP Location: Left Arm, Patient Position: Sitting, Cuff Size: Normal)   Pulse 94   Temp 97.7 F (36.5 C) (Oral)   Wt 170 lb (77.1 kg)   SpO2 92%   BMI 26.63 kg/m   Wt Readings from Last 3  Encounters:  08/14/17 170 lb (77.1 kg)  11/26/16 145 lb (65.8 kg)  10/17/16 150 lb (68 kg)    Physical Exam  Constitutional: He appears well-developed and well-nourished. No distress.  Eyes: No scleral icterus.  Cardiovascular: Normal rate and regular rhythm.  Pulses:      Dorsalis pedis pulses are 1+ on the right side, and 1+ on the left side.  Feet are not ice cold; varicosities on both legs  Pulmonary/Chest: Effort normal and breath sounds normal.  Musculoskeletal: He exhibits no edema.  Neurological: He is alert. He displays no atrophy and no tremor. He displays no seizure activity. Gait abnormal.  Question of slight cogwheeling; did poorly on the 6-CIT  Skin: No pallor.  Skin thickening and slight erythema and hyperpigmentation on the LEFT forearm; very mild induration; several superficial ecchymoses on the extensor surface of both arms; eczematous changes dorsum of LEFT hand  Psychiatric: He has a normal mood  and affect. His mood appears not anxious. He does not exhibit a depressed mood.   6CIT Screen 08/14/2017  What Year? 4 points  What month? 3 points  What time? 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  Results for orders placed or performed during the hospital encounter of 10/17/16  CBC  Result Value Ref Range   WBC 11.5 (H) 3.8 - 10.6 K/uL   RBC 4.65 4.40 - 5.90 MIL/uL   Hemoglobin 14.0 13.0 - 18.0 g/dL   HCT 40.9 40.0 - 52.0 %   MCV 88.0 80.0 - 100.0 fL   MCH 30.1 26.0 - 34.0 pg   MCHC 34.2 32.0 - 36.0 g/dL   RDW 16.1 (H) 11.5 - 14.5 %   Platelets 398 150 - 440 K/uL      Assessment & Plan:   Problem List Items Addressed This Visit      Cardiovascular and Mediastinum   Varicose veins of both lower extremities with pain    Compression stockings; will discuss further at next appt; I don't think this represents peripheral vascular disease at this time and will not  order ABI studies      Small vessel disease, cerebrovascular    Noted on the CT scan done in the ER in February 2018; discussed with patient as well as his wife and son; refer to neurologist; check lipid panel      Relevant Orders   Ambulatory referral to Neurology   COMPLETE METABOLIC PANEL WITH GFR   Lipid panel   Essential hypertension, benign    Controlled on no medicines; try to DASH guidelines        Nervous and Auditory   Thalamic infarction [facility])    Noted on CT scan; daily aspirin; check lipid      Relevant Orders   Lipid panel   Diffuse brain atrophy    Refer to neurologist      Relevant Orders   Ambulatory referral to Neurology   Acquired cerebral ventriculomegaly    Noted on CT scan back in February 2018 when he fell; reviewed findings with patient and wife and son; NPH is in the differential; refer to neurologist        Other   Memory impairment - Primary    Refer to neuro      Relevant Orders   Ambulatory referral to Neurology   B12   TSH   Hypocalcemia    Check vit D      Relevant Orders   VITAMIN D 25 Hydroxy (Vit-D Deficiency, Fractures)   Encounter for completion of form with patient    Completed the FL-2 form for patient; son asked for that to be sent to his office and he will start the process of finding a facility      Elevated white blood cell count    Noted in Feb 2018; check labs      Relevant Orders   CBC with Differential/Platelet   Blood in urine    Recheck urine today; he never had the CT scan      Relevant Orders   Urinalysis w microscopic + reflex cultur    Other Visit Diagnoses    Needs flu shot       Relevant Orders   Flu vaccine HIGH DOSE PF (Fluzone High dose) (Completed)   Arm skin lesion, left  Follow up plan: Return in about 2 weeks (around 08/28/2017) for follow-up visit with Dr. Sanda Klein.  An after-visit summary was printed and given to the patient at [place].  Please see the patient instructions  which may contain other information and recommendations beyond what is mentioned above in the assessment and plan.  Meds ordered this encounter  Medications  . DISCONTD: meloxicam (MOBIC) 7.5 MG tablet    Sig: Take by mouth.  . triamcinolone cream (KENALOG) 0.1 %    Sig: Apply 1 application 2 (two) times daily topically. (to the left arm)    Dispense:  30 g    Refill:  0    Orders Placed This Encounter  Procedures  . Flu vaccine HIGH DOSE PF (Fluzone High dose)  . COMPLETE METABOLIC PANEL WITH GFR  . Lipid panel  . VITAMIN D 25 Hydroxy (Vit-D Deficiency, Fractures)  . Urinalysis w microscopic + reflex cultur  . B12  . TSH  . CBC with Differential/Platelet  . Ambulatory referral to Neurology   Face-to-face time with patient was more than 40 minutes, >50% time spent counseling and coordination of care

## 2017-08-15 ENCOUNTER — Other Ambulatory Visit: Payer: Self-pay | Admitting: Family Medicine

## 2017-08-15 DIAGNOSIS — R17 Unspecified jaundice: Secondary | ICD-10-CM

## 2017-08-15 LAB — NO CULTURE INDICATED

## 2017-08-15 LAB — COMPLETE METABOLIC PANEL WITH GFR
AG Ratio: 1.4 (calc) (ref 1.0–2.5)
ALKALINE PHOSPHATASE (APISO): 52 U/L (ref 40–115)
ALT: 16 U/L (ref 9–46)
AST: 22 U/L (ref 10–35)
Albumin: 4.2 g/dL (ref 3.6–5.1)
BUN: 10 mg/dL (ref 7–25)
CALCIUM: 9.2 mg/dL (ref 8.6–10.3)
CO2: 30 mmol/L (ref 20–32)
CREATININE: 0.88 mg/dL (ref 0.70–1.11)
Chloride: 101 mmol/L (ref 98–110)
GFR, EST NON AFRICAN AMERICAN: 81 mL/min/{1.73_m2} (ref >=60)
GFR, Est African American: 93 mL/min/{1.73_m2} (ref >=60)
Globulin: 3.1 g/dL (calc) (ref 1.9–3.7)
Glucose, Bld: 101 mg/dL — ABNORMAL HIGH (ref 65–99)
Potassium: 4.6 mmol/L (ref 3.5–5.3)
Sodium: 139 mmol/L (ref 135–146)
Total Bilirubin: 1.8 mg/dL — ABNORMAL HIGH (ref 0.2–1.2)
Total Protein: 7.3 g/dL (ref 6.1–8.1)

## 2017-08-15 LAB — LIPID PANEL
CHOL/HDL RATIO: 3.3 (calc) (ref <5.0)
CHOLESTEROL: 142 mg/dL (ref <200)
HDL: 43 mg/dL (ref >40)
LDL Cholesterol (Calc): 81 mg/dL (calc)
NON-HDL CHOLESTEROL (CALC): 99 mg/dL (ref <130)
TRIGLYCERIDES: 96 mg/dL (ref <150)

## 2017-08-15 LAB — URINALYSIS W MICROSCOPIC + REFLEX CULTURE
BILIRUBIN URINE: NEGATIVE
Bacteria, UA: NONE SEEN /HPF
GLUCOSE, UA: NEGATIVE
Hgb urine dipstick: NEGATIVE
Hyaline Cast: NONE SEEN /LPF
KETONES UR: NEGATIVE
LEUKOCYTE ESTERASE: NEGATIVE
Nitrites, Initial: NEGATIVE
Specific Gravity, Urine: 1.014 (ref 1.001–1.03)
Squamous Epithelial / LPF: NONE SEEN /HPF (ref <=5)
WBC, UA: NONE SEEN /HPF (ref 0–5)

## 2017-08-15 LAB — VITAMIN D 25 HYDROXY (VIT D DEFICIENCY, FRACTURES): VIT D 25 HYDROXY: 11 ng/mL — AB (ref 30–100)

## 2017-08-15 LAB — VITAMIN B12: Vitamin B-12: 401 pg/mL (ref 200–1100)

## 2017-08-15 LAB — TSH: TSH: 3.34 mIU/L (ref 0.40–4.50)

## 2017-08-15 MED ORDER — VITAMIN D (ERGOCALCIFEROL) 1.25 MG (50000 UNIT) PO CAPS: 50000.0000 [IU] | ORAL_CAPSULE | ORAL | 1 refills | Status: AC

## 2017-08-15 NOTE — Progress Notes (Signed)
Start Rx vit D Ordered RUQ [place] and hepatic function panel

## 2017-08-15 NOTE — Assessment & Plan Note (Signed)
Check [place] and recheck hepatic function panel at f/u in 1-2 weeks

## 2017-09-01 ENCOUNTER — Ambulatory Visit: Payer: PPO | Admitting: Family Medicine

## 2017-09-11 ENCOUNTER — Ambulatory Visit: Payer: PPO

## 2017-10-03 ENCOUNTER — Encounter: Payer: Self-pay | Admitting: Family Medicine

## 2017-10-03 ENCOUNTER — Ambulatory Visit (INDEPENDENT_AMBULATORY_CARE_PROVIDER_SITE_OTHER): Payer: PPO | Admitting: Family Medicine

## 2017-10-03 VITALS — BP 142/86 | HR 81 | Temp 98.2°F | Resp 14 | Wt 170.8 lb

## 2017-10-03 DIAGNOSIS — R17 Unspecified jaundice: Secondary | ICD-10-CM | POA: Diagnosis not present

## 2017-10-03 DIAGNOSIS — G319 Degenerative disease of nervous system, unspecified: Secondary | ICD-10-CM | POA: Diagnosis not present

## 2017-10-03 DIAGNOSIS — R3129 Other microscopic hematuria: Secondary | ICD-10-CM

## 2017-10-03 DIAGNOSIS — D72828 Other elevated white blood cell count: Secondary | ICD-10-CM

## 2017-10-03 DIAGNOSIS — D485 Neoplasm of uncertain behavior of skin: Secondary | ICD-10-CM

## 2017-10-03 DIAGNOSIS — K032 Erosion of teeth: Secondary | ICD-10-CM

## 2017-10-03 DIAGNOSIS — E559 Vitamin D deficiency, unspecified: Secondary | ICD-10-CM

## 2017-10-03 NOTE — Assessment & Plan Note (Signed)
No blood on micro or dip

## 2017-10-03 NOTE — Patient Instructions (Signed)
Try QUINOA as a complete protein We'll have you see the specialist about your teeth Let us know if there is anything we can do to help with placement We'll get you to the dermatologist

## 2017-10-03 NOTE — Assessment & Plan Note (Signed)
Recheck bilirubin

## 2017-10-03 NOTE — Assessment & Plan Note (Signed)
See neurologist next week

## 2017-10-03 NOTE — Progress Notes (Signed)
BP (!) 142/86   Pulse 81   Temp 98.2 F (36.8 C) (Oral)   Resp 14   Wt 170 lb 12.8 oz (77.5 kg)   SpO2 95%   BMI 26.75 kg/m    Subjective:    Patient ID: Brett Floyd, male    DOB: December 27, 1933, 84 y.o.   MRN: [id number]  HPI: Brett Floyd is a 84 y.o. male  Chief Complaint  Patient presents with  . Follow-up    2 weeks  . Mass    check place on right arm, u gave cream for it but not getting any better    HPI Patient is here for f/u  Place on right arm; it's domed and bothers him; been there for a few months, getting bigger; son thinks he picks at it  His teeth are terrible; bottom row needs to be pulled; cannot chew; protein intake is null practically says his son; he cannot chew the [place] food vegetables or chicken  Short-term memory is "terrible"; seeing neurologist next week; he can recall things from many years; cannot remember what day it is; not keeping up with the current events  Vitamin D was only 11; he has been taking vitamin D for only one month, did not get the refill; memory impairment and loose stools  Depression screen [facility] 2/9 08/14/2017 06/21/2016  Decreased Interest 0 0  Down, Depressed, Hopeless 0 0  PHQ - 2 Score 0 0    Relevant past medical, surgical, family and social history reviewed Past Medical History:  Diagnosis Date  . Hypertension    Past Surgical History:  Procedure Laterality Date  . HERNIA REPAIR    . MOUTH SURGERY     Family History  Problem Relation Age of Onset  . Cancer Mother        pancreatic cancer  . Hypertension Father   . Stroke Father   . Heart disease Father   . Hypertension Son    Social History   Tobacco Use  . Smoking status: Never Smoker  . Smokeless tobacco: Never Used  Substance Use Topics  . Alcohol use: No  . Drug use: No    Interim medical history since last visit reviewed. Allergies and medications reviewed  Review of Systems Per HPI unless specifically indicated above       Objective:    BP (!) 142/86   Pulse 81   Temp 98.2 F (36.8 C) (Oral)   Resp 14   Wt 170 lb 12.8 oz (77.5 kg)   SpO2 95%   BMI 26.75 kg/m   Wt Readings from Last 3 Encounters:  10/03/17 170 lb 12.8 oz (77.5 kg)  08/14/17 170 lb (77.1 kg)  11/26/16 145 lb (65.8 kg)    Physical Exam  Constitutional: He appears well-developed and well-nourished. No distress.  Eyes: No scleral icterus.  Cardiovascular: Normal rate and regular rhythm.  Pulses:      Dorsalis pedis pulses are 1+ on the right side, and 1+ on the left side.  Feet are not ice cold; varicosities on both legs  Pulmonary/Chest: Effort normal and breath sounds normal.  Musculoskeletal: He exhibits no edema.  Neurological: He is alert. He displays no atrophy and no tremor. He displays no seizure activity. Gait abnormal.  Question of slight cogwheeling; did poorly on the 6-CIT  Skin: Lesion (right proximal forearm, domed erythematous lesion with central umbilication, telangiectasia, 6 to 7 mm diam) noted. No pallor.  Skin thickening and slight erythema and  hyperpigmentation on the LEFT forearm; very mild induration; several superficial ecchymoses on the extensor surface of both arms; eczematous changes dorsum of LEFT hand  Psychiatric: He has a normal mood and affect. His mood appears not anxious. He does not exhibit a depressed mood.       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  An after-visit summary was printed and given to the patient at [place].  Please see the patient instructions which may contain other information and recommendations beyond what is mentioned above in the assessment and plan.  No orders of the defined types were placed in this encounter.   Orders Placed This Encounter  Procedures  . Hepatic function panel  . VITAMIN D 25 Hydroxy (Vit-D Deficiency, Fractures)  . Ambulatory referral to Dermatology  . Ambulatory referral to Oral Maxillofacial Surgery

## 2017-10-03 NOTE — Assessment & Plan Note (Signed)
resolved 

## 2017-10-04 LAB — VITAMIN D 25 HYDROXY (VIT D DEFICIENCY, FRACTURES): Vit D, 25-Hydroxy: 36 ng/mL (ref 30–100)

## 2017-10-04 LAB — HEPATIC FUNCTION PANEL
AG RATIO: 1.5 (calc) (ref 1.0–2.5)
ALKALINE PHOSPHATASE (APISO): 49 U/L (ref 40–115)
ALT: 12 U/L (ref 9–46)
AST: 18 U/L (ref 10–35)
Albumin: 4.1 g/dL (ref 3.6–5.1)
BILIRUBIN TOTAL: 1.7 mg/dL — AB (ref 0.2–1.2)
Bilirubin, Direct: 0.3 mg/dL — ABNORMAL HIGH (ref 0.0–0.2)
GLOBULIN: 2.8 g/dL (ref 1.9–3.7)
Indirect Bilirubin: 1.4 mg/dL (calc) — ABNORMAL HIGH (ref 0.2–1.2)
Total Protein: 6.9 g/dL (ref 6.1–8.1)

## 2017-10-06 ENCOUNTER — Telehealth: Payer: Self-pay

## 2017-10-06 NOTE — Telephone Encounter (Signed)
Called pt's son. No answer. LM for son informing him of the information below. CRM created. Results routed to [facility].

## 2017-10-06 NOTE — Telephone Encounter (Signed)
-----   Message from Arnetha Courser, MD sent at 10/03/2017  5:44 PM EST ----- Please let son know that patient's bilirubin is still elevated; I am very interested in the liver ultrasound that was ordered in November, and hope they'll be able to have that done soon; I see it's now scheduled for January 18th; we'll contact him as soon as it's read Thank you

## 2017-10-09 DIAGNOSIS — R413 Other amnesia: Secondary | ICD-10-CM | POA: Diagnosis not present

## 2017-10-17 ENCOUNTER — Ambulatory Visit
Admission: RE | Admit: 2017-10-17 | Discharge: 2017-10-17 | Disposition: A | Discharge status: Home / Self Care | Payer: PPO | Source: Ambulatory Visit | Attending: Family Medicine | Admitting: Family Medicine

## 2017-10-17 DIAGNOSIS — R17 Unspecified jaundice: Secondary | ICD-10-CM

## 2017-10-22 ENCOUNTER — Telehealth: Payer: Self-pay | Admitting: Family Medicine

## 2017-10-22 ENCOUNTER — Other Ambulatory Visit: Payer: Self-pay

## 2017-10-22 DIAGNOSIS — R9389 Abnormal findings on diagnostic imaging of other specified body structures: Secondary | ICD-10-CM

## 2017-10-22 NOTE — Telephone Encounter (Signed)
Pt's wife called back.  Stated she understands and agrees with a referral to GI.  Prefers to see a GI doctor in [place], and needs to make this appt. on a Thurs. or Fri. in March, so her son can take them.  Explained that they have several appt's. coming up, and that is the reason to schedule with GI in March.  Will make Dr. Delight Ovens office aware.

## 2017-10-22 NOTE — Telephone Encounter (Signed)
Pt returned call for the results of his [place].Marland Kitchen  Results of [place]; he either has fatty liver or some process going on with his liver;  less likely to think it is fatty liver, given that he's not obese and his cholesterol panel looked good; Dr Sanda Klein  suggests that the patient see a liver doctor to evaluate him and work up the abnormal ultrasound and lab tests; PLEASE REFER him to GI provider of his choice. Pt advised to decide on a GI provider [facility] or [place]) at a time that his son can take him; not urgent, appt can be 2-3 weeks out;  Pt and wife voiced understanding and that they will get back in touch with Dr. Sanda Klein.

## 2017-11-13 ENCOUNTER — Ambulatory Visit: Payer: PPO | Admitting: Family Medicine

## 2017-11-20 ENCOUNTER — Ambulatory Visit: Payer: PPO | Admitting: Family Medicine

## 2017-11-20 ENCOUNTER — Encounter: Payer: Self-pay | Admitting: Family Medicine

## 2017-11-20 ENCOUNTER — Ambulatory Visit (INDEPENDENT_AMBULATORY_CARE_PROVIDER_SITE_OTHER): Payer: PPO | Admitting: Family Medicine

## 2017-11-20 VITALS — BP 148/96 | HR 97 | Temp 97.9°F | Resp 16 | Wt 164.6 lb

## 2017-11-20 DIAGNOSIS — R197 Diarrhea, unspecified: Secondary | ICD-10-CM | POA: Diagnosis not present

## 2017-11-20 DIAGNOSIS — I10 Essential (primary) hypertension: Secondary | ICD-10-CM | POA: Diagnosis not present

## 2017-11-20 DIAGNOSIS — R638 Other symptoms and signs concerning food and fluid intake: Secondary | ICD-10-CM | POA: Diagnosis not present

## 2017-11-20 MED ORDER — LISINOPRIL 5 MG PO TABS: 5.0000 mg | ORAL_TABLET | Freq: Every day | ORAL | 0 refills | Status: AC

## 2017-11-20 NOTE — Progress Notes (Signed)
Name: Brett Floyd   MRN: [id number]    DOB: March 15, 1934   Date:11/20/2017       Progress Note  Subjective  Chief Complaint  Chief Complaint  Patient presents with  . Diarrhea    Onset-2 weeks, intermittently, denies any fever abdominal pain and loss of appetite.     HPI  Pt presents with his son who provides most of the history. PT has had intermittent diarrhea for about 2 weeks. Has early signs of alzheimer's, losing ability to taste without a lot seasoning, pt is mostly homebound and doesn't get a lot exercise, unsteady on his feet as well.  Eating cereals, graham crackers with peanutbutter, soups, crackers, etc, and patient doesn't always feel hungry.  Pt has a hard time chewing because he does not have a lower denture - only an upper.  Diarrhea occurs on days where he has a more substantial meal - often after a day or two of not having a BM.  Did have one episode of stool incontinence because he was unable to make it to the bathroom in time due to being in transport from a restaurant to the car. - Has lost 6lbs since 10/03/17. - Son has been giving patient boost every now and then when patient is willing to drink it. - He is scheduled to see a GI doctor - Dr. Marius Ditch on 11/28/2017 to discuss abnormal [place] from 10/17/2017. - Denies nausea, vomiting, abdominal pain, no blood in stools, mucous in stools, pale colored stools, no fevers/chills. - Memory Impairment - Has see Dr. Manuella Ghazi with [facility]; they are working with him regarding his early alzheimers.  Was recommended to take Aricept 5mg , but patient only takes it sometimes  HTN: Has not been taking Lisinopril for some time as his BP had been previously well controlled. Today BP is 150/110.  Denies headaches, dizziness, lightheadedness, facial droop, speech changes, change in mental status baseline, extremity weakness.   Patient Active Problem List   Diagnosis Date Noted  . Elevated bilirubin 08/15/2017  . Small vessel disease,  cerebrovascular 08/14/2017  . Diffuse brain atrophy 08/14/2017  . Memory impairment 08/14/2017  . Thalamic infarction ([place]) 08/14/2017  . Acquired cerebral ventriculomegaly 08/14/2017  . Encounter for completion of form with patient 08/14/2017  . Arthralgia of both hands 08/26/2016  . Prostate cancer screening 08/07/2016  . Blood in urine 08/07/2016  . Varicose veins of both lower extremities with pain 07/25/2016  . CRP elevated 07/25/2016  . Essential hypertension, benign 06/21/2016  . Elevated white blood cell count 06/21/2016  . Hypocalcemia 06/21/2016  . LVH (left ventricular hypertrophy) 06/21/2016    Social History   Tobacco Use  . Smoking status: Never Smoker  . Smokeless tobacco: Never Used  Substance Use Topics  . Alcohol use: No     Current Outpatient Medications:  .  aspirin EC 81 MG tablet, Take 1 tablet (81 mg total) by mouth daily., Disp: , Rfl:  .  triamcinolone cream (KENALOG) 0.1 %, Apply 1 application 2 (two) times daily topically. (to the left arm), Disp: 30 g, Rfl: 0 .  lisinopril (PRINIVIL,ZESTRIL) 5 MG tablet, Take 1 tablet (5 mg total) by mouth daily., Disp: 30 tablet, Rfl: 0  Allergies  Allergen Reactions  . Erythromycin Nausea Only    ROS  Constitutional: Negative for fever or weight change.  Respiratory: Negative for cough and shortness of breath.   Cardiovascular: Negative for chest pain or palpitations.  Gastrointestinal: See HPI Musculoskeletal: Negative for gait  problem or joint swelling.  Skin: Negative for rash.  Neurological: Negative for dizziness or headache.  No other specific complaints in a complete review of systems (except as listed in HPI above).  Objective  Vitals:   11/20/17 1321 11/20/17 1350  BP: (!) 150/110 (!) 148/96  Pulse: 97   Resp: 16   Temp: 97.9 F (36.6 C)   TempSrc: Oral   SpO2: 96%   Weight: 164 lb 9.6 oz (74.7 kg)    Body mass index is 25.78 kg/m.   Nursing Note and Vital Signs  reviewed.  Physical Exam  Constitutional: Patient appears well-developed and well-nourished. Obese. No distress.  HEENT: head atraumatic, normocephalic Cardiovascular: Normal rate, regular rhythm, S1/S2 present.  No murmur or rub heard. No BLE edema. Pulmonary/Chest: Effort normal and breath sounds clear. No respiratory distress or retractions. Abdominal: Soft and non-tender, bowel sounds present x4 quadrants.  No CVA Tenderness Psychiatric: Patient has a normal mood and affect. behavior is normal. Judgment and thought content normal.  No results found for this or any previous visit (from the past 72 hour(s)).  Assessment & Plan  1. Diarrhea, unspecified type - COMPLETE METABOLIC PANEL WITH GFR - CBC w/Diff/Platelet - Monitor for red flags  2. Decreased oral intake - Increase seasoning on foods, drink boost as needed to supplement.  We will check CMP today to look for protein malnutrition. - COMPLETE METABOLIC PANEL WITH GFR  3. Essential hypertension, benign - We will start at low dose lisinopril - pt has tolerated well in the past, however he is skeptical about taking medications and would like to start at a low dose.  Close follow up in 2 weeks with PCP Dr. Sanda Klein. - lisinopril (PRINIVIL,ZESTRIL) 5 MG tablet; Take 1 tablet (5 mg total) by mouth daily.  Dispense: 30 tablet; Refill: 0 - COMPLETE METABOLIC PANEL WITH GFR   -Red flags and when to present for emergency care or RTC including fever >101.81F, chest pain, shortness of breath, new/worsening/un-resolving symptoms, reviewed with patient at time of visit. Follow up and care instructions discussed and provided in AVS.

## 2017-11-21 LAB — CBC WITH DIFFERENTIAL/PLATELET
BASOS PCT: 1.2 %
Basophils Absolute: 91 cells/uL (ref 0–200)
EOS PCT: 6 %
Eosinophils Absolute: 456 cells/uL (ref 15–500)
HEMATOCRIT: 45.9 % (ref 38.5–50.0)
HEMOGLOBIN: 16.1 g/dL (ref 13.2–17.1)
Lymphs Abs: 1664 cells/uL (ref 850–3900)
MCH: 32.5 pg (ref 27.0–33.0)
MCHC: 35.1 g/dL (ref 32.0–36.0)
MCV: 92.5 fL (ref 80.0–100.0)
MPV: 9.8 fL (ref 7.5–12.5)
Monocytes Relative: 8.9 %
NEUTROS ABS: 4712 {cells}/uL (ref 1500–7800)
NEUTROS PCT: 62 %
Platelets: 311 10*3/uL (ref 140–400)
RBC: 4.96 10*6/uL (ref 4.20–5.80)
RDW: 12.4 % (ref 11.0–15.0)
Total Lymphocyte: 21.9 %
WBC: 7.6 10*3/uL (ref 3.8–10.8)
WBCMIX: 676 {cells}/uL (ref 200–950)

## 2017-11-21 LAB — COMPLETE METABOLIC PANEL WITH GFR
AG Ratio: 1.5 (calc) (ref 1.0–2.5)
ALBUMIN MSPROF: 4.1 g/dL (ref 3.6–5.1)
ALKALINE PHOSPHATASE (APISO): 49 U/L (ref 40–115)
ALT: 18 U/L (ref 9–46)
AST: 24 U/L (ref 10–35)
BILIRUBIN TOTAL: 1.7 mg/dL — AB (ref 0.2–1.2)
BUN: 7 mg/dL (ref 7–25)
CHLORIDE: 100 mmol/L (ref 98–110)
CO2: 31 mmol/L (ref 20–32)
CREATININE: 0.9 mg/dL (ref 0.70–1.11)
Calcium: 9.3 mg/dL (ref 8.6–10.3)
GFR, Est African American: 92 mL/min/{1.73_m2} (ref >=60)
GFR, Est Non African American: 79 mL/min/{1.73_m2} (ref >=60)
GLUCOSE: 96 mg/dL (ref 65–99)
Globulin: 2.8 g/dL (calc) (ref 1.9–3.7)
Potassium: 4.2 mmol/L (ref 3.5–5.3)
Sodium: 140 mmol/L (ref 135–146)
Total Protein: 6.9 g/dL (ref 6.1–8.1)

## 2017-11-28 ENCOUNTER — Encounter: Payer: Self-pay | Admitting: Emergency Medicine

## 2017-11-28 ENCOUNTER — Emergency Department: Payer: PPO

## 2017-11-28 ENCOUNTER — Encounter: Payer: Self-pay | Admitting: Gastroenterology

## 2017-11-28 ENCOUNTER — Ambulatory Visit (INDEPENDENT_AMBULATORY_CARE_PROVIDER_SITE_OTHER): Payer: PPO | Admitting: Gastroenterology

## 2017-11-28 ENCOUNTER — Other Ambulatory Visit: Payer: Self-pay

## 2017-11-28 ENCOUNTER — Emergency Department
Admission: EM | Admit: 2017-11-28 | Discharge: 2017-11-28 | Disposition: A | Discharge status: Other Acute Inpatient Hospital | Payer: PPO | Attending: Emergency Medicine | Admitting: Emergency Medicine

## 2017-11-28 VITALS — BP 134/94 | HR 90 | Temp 98.1°F | Wt 162.4 lb

## 2017-11-28 DIAGNOSIS — R140 Abdominal distension (gaseous): Secondary | ICD-10-CM

## 2017-11-28 DIAGNOSIS — I6200 Nontraumatic subdural hemorrhage, unspecified: Secondary | ICD-10-CM | POA: Diagnosis not present

## 2017-11-28 DIAGNOSIS — I671 Cerebral aneurysm, nonruptured: Secondary | ICD-10-CM | POA: Diagnosis not present

## 2017-11-28 DIAGNOSIS — S01111A Laceration without foreign body of right eyelid and periocular area, initial encounter: Secondary | ICD-10-CM | POA: Diagnosis not present

## 2017-11-28 DIAGNOSIS — G20 Parkinson's disease: Secondary | ICD-10-CM | POA: Diagnosis not present

## 2017-11-28 DIAGNOSIS — K117 Disturbances of salivary secretion: Secondary | ICD-10-CM | POA: Diagnosis not present

## 2017-11-28 DIAGNOSIS — I10 Essential (primary) hypertension: Secondary | ICD-10-CM | POA: Diagnosis not present

## 2017-11-28 DIAGNOSIS — S065XAA Traumatic subdural hemorrhage with loss of consciousness status unknown, initial encounter: Secondary | ICD-10-CM

## 2017-11-28 DIAGNOSIS — R451 Restlessness and agitation: Secondary | ICD-10-CM | POA: Diagnosis not present

## 2017-11-28 DIAGNOSIS — S0011XA Contusion of right eyelid and periocular area, initial encounter: Secondary | ICD-10-CM | POA: Diagnosis not present

## 2017-11-28 DIAGNOSIS — S3991XA Unspecified injury of abdomen, initial encounter: Secondary | ICD-10-CM | POA: Diagnosis not present

## 2017-11-28 DIAGNOSIS — I7771 Dissection of carotid artery: Secondary | ICD-10-CM | POA: Diagnosis not present

## 2017-11-28 DIAGNOSIS — S0990XA Unspecified injury of head, initial encounter: Secondary | ICD-10-CM | POA: Diagnosis not present

## 2017-11-28 DIAGNOSIS — K5900 Constipation, unspecified: Secondary | ICD-10-CM | POA: Diagnosis not present

## 2017-11-28 DIAGNOSIS — S3993XA Unspecified injury of pelvis, initial encounter: Secondary | ICD-10-CM | POA: Diagnosis not present

## 2017-11-28 DIAGNOSIS — Y92513 Shop (commercial) as the place of occurrence of the external cause: Secondary | ICD-10-CM | POA: Diagnosis not present

## 2017-11-28 DIAGNOSIS — S061X0A Traumatic cerebral edema without loss of consciousness, initial encounter: Secondary | ICD-10-CM | POA: Diagnosis not present

## 2017-11-28 DIAGNOSIS — F0280 Dementia in other diseases classified elsewhere without behavioral disturbance: Secondary | ICD-10-CM | POA: Insufficient documentation

## 2017-11-28 DIAGNOSIS — Z4682 Encounter for fitting and adjustment of non-vascular catheter: Secondary | ICD-10-CM | POA: Diagnosis not present

## 2017-11-28 DIAGNOSIS — M2578 Osteophyte, vertebrae: Secondary | ICD-10-CM | POA: Diagnosis not present

## 2017-11-28 DIAGNOSIS — S065X0A Traumatic subdural hemorrhage without loss of consciousness, initial encounter: Secondary | ICD-10-CM | POA: Insufficient documentation

## 2017-11-28 DIAGNOSIS — I720 Aneurysm of carotid artery: Secondary | ICD-10-CM | POA: Diagnosis not present

## 2017-11-28 DIAGNOSIS — Z79899 Other long term (current) drug therapy: Secondary | ICD-10-CM | POA: Insufficient documentation

## 2017-11-28 DIAGNOSIS — Z7982 Long term (current) use of aspirin: Secondary | ICD-10-CM | POA: Insufficient documentation

## 2017-11-28 DIAGNOSIS — R9431 Abnormal electrocardiogram [ECG] [EKG]: Secondary | ICD-10-CM | POA: Diagnosis not present

## 2017-11-28 DIAGNOSIS — S3992XA Unspecified injury of lower back, initial encounter: Secondary | ICD-10-CM | POA: Diagnosis not present

## 2017-11-28 DIAGNOSIS — S065X9A Traumatic subdural hemorrhage with loss of consciousness of unspecified duration, initial encounter: Secondary | ICD-10-CM

## 2017-11-28 DIAGNOSIS — W010XXA Fall on same level from slipping, tripping and stumbling without subsequent striking against object, initial encounter: Secondary | ICD-10-CM | POA: Diagnosis not present

## 2017-11-28 DIAGNOSIS — S0191XA Laceration without foreign body of unspecified part of head, initial encounter: Secondary | ICD-10-CM | POA: Insufficient documentation

## 2017-11-28 DIAGNOSIS — W1830XA Fall on same level, unspecified, initial encounter: Secondary | ICD-10-CM | POA: Diagnosis not present

## 2017-11-28 DIAGNOSIS — S2241XA Multiple fractures of ribs, right side, initial encounter for closed fracture: Secondary | ICD-10-CM | POA: Diagnosis not present

## 2017-11-28 DIAGNOSIS — G9389 Other specified disorders of brain: Secondary | ICD-10-CM | POA: Diagnosis not present

## 2017-11-28 DIAGNOSIS — M47816 Spondylosis without myelopathy or radiculopathy, lumbar region: Secondary | ICD-10-CM | POA: Diagnosis not present

## 2017-11-28 DIAGNOSIS — G309 Alzheimer's disease, unspecified: Secondary | ICD-10-CM | POA: Insufficient documentation

## 2017-11-28 DIAGNOSIS — R1312 Dysphagia, oropharyngeal phase: Secondary | ICD-10-CM | POA: Diagnosis not present

## 2017-11-28 DIAGNOSIS — Z515 Encounter for palliative care: Secondary | ICD-10-CM | POA: Diagnosis not present

## 2017-11-28 DIAGNOSIS — Z743 Need for continuous supervision: Secondary | ICD-10-CM | POA: Diagnosis not present

## 2017-11-28 DIAGNOSIS — G935 Compression of brain: Secondary | ICD-10-CM | POA: Diagnosis not present

## 2017-11-28 DIAGNOSIS — S51012A Laceration without foreign body of left elbow, initial encounter: Secondary | ICD-10-CM | POA: Diagnosis not present

## 2017-11-28 DIAGNOSIS — S0101XA Laceration without foreign body of scalp, initial encounter: Secondary | ICD-10-CM | POA: Diagnosis not present

## 2017-11-28 DIAGNOSIS — S61411A Laceration without foreign body of right hand, initial encounter: Secondary | ICD-10-CM | POA: Diagnosis not present

## 2017-11-28 DIAGNOSIS — Y999 Unspecified external cause status: Secondary | ICD-10-CM | POA: Insufficient documentation

## 2017-11-28 DIAGNOSIS — F05 Delirium due to known physiological condition: Secondary | ICD-10-CM | POA: Diagnosis not present

## 2017-11-28 DIAGNOSIS — G319 Degenerative disease of nervous system, unspecified: Secondary | ICD-10-CM | POA: Diagnosis not present

## 2017-11-28 DIAGNOSIS — R4182 Altered mental status, unspecified: Secondary | ICD-10-CM | POA: Diagnosis not present

## 2017-11-28 DIAGNOSIS — R0600 Dyspnea, unspecified: Secondary | ICD-10-CM | POA: Diagnosis not present

## 2017-11-28 DIAGNOSIS — R52 Pain, unspecified: Secondary | ICD-10-CM | POA: Diagnosis not present

## 2017-11-28 DIAGNOSIS — S80211A Abrasion, right knee, initial encounter: Secondary | ICD-10-CM | POA: Diagnosis not present

## 2017-11-28 DIAGNOSIS — R918 Other nonspecific abnormal finding of lung field: Secondary | ICD-10-CM | POA: Diagnosis not present

## 2017-11-28 DIAGNOSIS — S199XXA Unspecified injury of neck, initial encounter: Secondary | ICD-10-CM | POA: Diagnosis not present

## 2017-11-28 DIAGNOSIS — Z66 Do not resuscitate: Secondary | ICD-10-CM | POA: Diagnosis not present

## 2017-11-28 DIAGNOSIS — R17 Unspecified jaundice: Secondary | ICD-10-CM | POA: Diagnosis not present

## 2017-11-28 DIAGNOSIS — G9341 Metabolic encephalopathy: Secondary | ICD-10-CM | POA: Diagnosis not present

## 2017-11-28 DIAGNOSIS — I440 Atrioventricular block, first degree: Secondary | ICD-10-CM | POA: Diagnosis not present

## 2017-11-28 DIAGNOSIS — Y9389 Activity, other specified: Secondary | ICD-10-CM | POA: Insufficient documentation

## 2017-11-28 DIAGNOSIS — J9811 Atelectasis: Secondary | ICD-10-CM | POA: Diagnosis not present

## 2017-11-28 DIAGNOSIS — R633 Feeding difficulties: Secondary | ICD-10-CM | POA: Diagnosis not present

## 2017-11-28 DIAGNOSIS — W19XXXA Unspecified fall, initial encounter: Secondary | ICD-10-CM | POA: Diagnosis not present

## 2017-11-28 DIAGNOSIS — S2231XA Fracture of one rib, right side, initial encounter for closed fracture: Secondary | ICD-10-CM | POA: Diagnosis not present

## 2017-11-28 DIAGNOSIS — Z23 Encounter for immunization: Secondary | ICD-10-CM | POA: Insufficient documentation

## 2017-11-28 DIAGNOSIS — S0511XA Contusion of eyeball and orbital tissues, right eye, initial encounter: Secondary | ICD-10-CM | POA: Diagnosis not present

## 2017-11-28 DIAGNOSIS — I6201 Nontraumatic acute subdural hemorrhage: Secondary | ICD-10-CM | POA: Diagnosis not present

## 2017-11-28 DIAGNOSIS — M546 Pain in thoracic spine: Secondary | ICD-10-CM | POA: Diagnosis not present

## 2017-11-28 DIAGNOSIS — S066X9A Traumatic subarachnoid hemorrhage with loss of consciousness of unspecified duration, initial encounter: Secondary | ICD-10-CM | POA: Diagnosis not present

## 2017-11-28 DIAGNOSIS — R000 Tachycardia, unspecified: Secondary | ICD-10-CM | POA: Diagnosis not present

## 2017-11-28 DIAGNOSIS — D62 Acute posthemorrhagic anemia: Secondary | ICD-10-CM | POA: Diagnosis not present

## 2017-11-28 DIAGNOSIS — M47812 Spondylosis without myelopathy or radiculopathy, cervical region: Secondary | ICD-10-CM | POA: Diagnosis not present

## 2017-11-28 HISTORY — DX: Dementia in other diseases classified elsewhere, unspecified severity, without behavioral disturbance, psychotic disturbance, mood disturbance, and anxiety: F02.80

## 2017-11-28 HISTORY — DX: Alzheimer's disease, unspecified: G30.9

## 2017-11-28 LAB — CBC WITH DIFFERENTIAL/PLATELET
BASOS PCT: 1 %
Basophils Absolute: 0.1 10*3/uL (ref 0–0.1)
EOS PCT: 3 %
Eosinophils Absolute: 0.3 10*3/uL (ref 0–0.7)
HCT: 47.9 % (ref 40.0–52.0)
Hemoglobin: 16 g/dL (ref 13.0–18.0)
Lymphocytes Relative: 16 %
Lymphs Abs: 1.6 10*3/uL (ref 1.0–3.6)
MCH: 31.9 pg (ref 26.0–34.0)
MCHC: 33.5 g/dL (ref 32.0–36.0)
MCV: 95.5 fL (ref 80.0–100.0)
MONO ABS: 0.9 10*3/uL (ref 0.2–1.0)
Monocytes Relative: 9 %
NEUTROS ABS: 7.1 10*3/uL — AB (ref 1.4–6.5)
Neutrophils Relative %: 71 %
PLATELETS: 264 10*3/uL (ref 150–440)
RBC: 5.01 MIL/uL (ref 4.40–5.90)
RDW: 13.3 % (ref 11.5–14.5)
WBC: 10 10*3/uL (ref 3.8–10.6)

## 2017-11-28 LAB — URINALYSIS, COMPLETE (UACMP) WITH MICROSCOPIC
BILIRUBIN URINE: NEGATIVE
Bacteria, UA: NONE SEEN
Glucose, UA: NEGATIVE mg/dL
Ketones, ur: NEGATIVE mg/dL
Leukocytes, UA: NEGATIVE
NITRITE: NEGATIVE
PH: 5 (ref 5.0–8.0)
Protein, ur: 30 mg/dL — AB
Specific Gravity, Urine: 1.014 (ref 1.005–1.030)
Squamous Epithelial / LPF: NONE SEEN

## 2017-11-28 LAB — BASIC METABOLIC PANEL
ANION GAP: 10 (ref 5–15)
BUN: 8 mg/dL (ref 6–20)
CALCIUM: 9 mg/dL (ref 8.9–10.3)
CO2: 26 mmol/L (ref 22–32)
Chloride: 102 mmol/L (ref 101–111)
Creatinine, Ser: 0.93 mg/dL (ref 0.61–1.24)
GFR calc Af Amer: >60 mL/min (ref >60)
GLUCOSE: 117 mg/dL — AB (ref 65–99)
Potassium: 3.5 mmol/L (ref 3.5–5.1)
SODIUM: 138 mmol/L (ref 135–145)

## 2017-11-28 MED ORDER — FENTANYL CITRATE (PF) 2500 MCG/50ML IJ SOLN
25.00 | INTRAMUSCULAR | Status: DC
Start: ? — End: 2017-11-28

## 2017-11-28 MED ORDER — HYDRALAZINE HCL 20 MG/ML IJ SOLN
10.00 mg | INTRAMUSCULAR | Status: DC
Start: ? — End: 2017-11-28

## 2017-11-28 MED ORDER — LABETALOL HCL 5 MG/ML IV SOLN
INTRAVENOUS | Status: AC
  Administered 2017-11-28: 5 mg via INTRAVENOUS
  Filled 2017-11-28: qty 4

## 2017-11-28 MED ORDER — DONEPEZIL HCL 10 MG PO TABS
5.00 mg | ORAL_TABLET | ORAL | Status: DC
Start: 2017-11-29 — End: 2017-11-28

## 2017-11-28 MED ORDER — LIDOCAINE 5 % EX PTCH
2.00 | MEDICATED_PATCH | CUTANEOUS | Status: DC
Start: 2017-11-30 — End: 2017-11-28

## 2017-11-28 MED ORDER — TETANUS-DIPHTH-ACELL PERTUSSIS 5-2.5-18.5 LF-MCG/0.5 IM SUSP
0.5000 mL | Freq: Once | INTRAMUSCULAR | Status: AC
  Administered 2017-11-28: 0.5 mL via INTRAMUSCULAR
  Filled 2017-11-28: qty 0.5

## 2017-11-28 MED ORDER — SODIUM CHLORIDE 0.9 % IV SOLN
50.00 | INTRAVENOUS | Status: DC
Start: ? — End: 2017-11-28

## 2017-11-28 MED ORDER — ONDANSETRON HCL 4 MG/2ML IJ SOLN
4.00 mg | INTRAMUSCULAR | Status: DC
Start: ? — End: 2017-11-28

## 2017-11-28 MED ORDER — LABETALOL HCL 5 MG/ML IV SOLN
5.0000 mg | Freq: Once | INTRAVENOUS | Status: AC
  Administered 2017-11-28: 5 mg via INTRAVENOUS

## 2017-11-28 MED ORDER — ACETAMINOPHEN 10 MG/ML IV SOLN
1000.00 mg | INTRAVENOUS | Status: DC
Start: 2017-11-29 — End: 2017-11-28

## 2017-11-28 NOTE — ED Notes (Signed)
Called [facility] per [place] and cancelled transfer, will call [facility] [phone]

## 2017-11-28 NOTE — ED Notes (Signed)
[facility] called wanting to get report on pt for transfer. Charge nurse unavailable to get report on pt after being placed on hold.  [facility] to call back for report in 15 minutes.

## 2017-11-28 NOTE — ED Notes (Signed)
[place] spoke to [place] for transfer  [phone]

## 2017-11-28 NOTE — ED Provider Notes (Signed)
[facility] Emergency Department Provider Note  ____________________________________________  Time seen: Approximately 4:28 PM  I have reviewed the triage vital signs and the nursing notes.   HISTORY  Chief Complaint Fall and Altered Mental Status  Level 5 Caveat: Portions of the History and Physical were unable to be obtained due to altered mental status.   HPI Brett Floyd is a 84 y.o. male brought to the ED by EMS after a fall at the drugstore. He has a history of Alzheimer's dementia but is normally conversant. He suddenly fell forward and was unable to catch himself when leaving the store. He had a loss of bowel control at that time as well. EMS put the patient on a spinal board and c-collar and brought him to the ED. Does not take blood thinners.     Past Medical History:  Diagnosis Date  . Alzheimer's dementia   . Hypertension      Patient Active Problem List   Diagnosis Date Noted  . Elevated bilirubin 08/15/2017  . Small vessel disease, cerebrovascular 08/14/2017  . Diffuse brain atrophy 08/14/2017  . Memory impairment 08/14/2017  . Thalamic infarction ([place]) 08/14/2017  . Acquired cerebral ventriculomegaly 08/14/2017  . Encounter for completion of form with patient 08/14/2017  . Arthralgia of both hands 08/26/2016  . Prostate cancer screening 08/07/2016  . Blood in urine 08/07/2016  . Varicose veins of both lower extremities with pain 07/25/2016  . CRP elevated 07/25/2016  . Essential hypertension, benign 06/21/2016  . Elevated white blood cell count 06/21/2016  . Hypocalcemia 06/21/2016  . LVH (left ventricular hypertrophy) 06/21/2016     Past Surgical History:  Procedure Laterality Date  . HERNIA REPAIR    . MOUTH SURGERY       Prior to Admission medications   Medication Sig Start Date End Date Taking? Authorizing Provider  aspirin EC 81 MG tablet Take 1 tablet (81 mg total) by mouth daily. 06/21/16   Lada, Satira Anis,  MD  donepezil (ARICEPT) 5 MG tablet TAKE 1 TABLET BY MOUTH EVERY DAY AT NIGHT 11/19/17   [provider]  lisinopril (PRINIVIL,ZESTRIL) 5 MG tablet Take 1 tablet (5 mg total) by mouth daily. 11/20/17   Hubbard Hartshorn, FNP  triamcinolone cream (KENALOG) 0.1 % Apply 1 application 2 (two) times daily topically. (to the left arm) 08/14/17   Lada, Satira Anis, MD     Allergies Erythromycin   Family History  Problem Relation Age of Onset  . Cancer Mother        pancreatic cancer  . Hypertension Father   . Stroke Father   . Heart disease Father   . Hypertension Son     Social History Social History   Tobacco Use  . Smoking status: Never Smoker  . Smokeless tobacco: Never Used  Substance Use Topics  . Alcohol use: No  . Drug use: No    Review of Systems Level V caveat, unable to reliably obtained due to altered mental status ____________________________________________   PHYSICAL EXAM:  VITAL SIGNS: ED Triage Vitals  Enc Vitals Group     BP --      Pulse Rate 11/28/17 1618 95     Resp 11/28/17 1618 19     Temp 11/28/17 1618 97.9 F (36.6 C)     Temp Source 11/28/17 1618 Oral     SpO2 11/28/17 1618 96 %     Weight 11/28/17 1615 162 lb (73.5 kg)     Height --  Head Circumference --      Peak Flow --      Pain Score --      Pain Loc --      Pain Edu? --      Excl. in [place]? --     Vital signs reviewed, nursing assessments reviewed.   Constitutional:   Alert and oriented to self and birthday. Well appearing and in no distress. Eyes:   No scleral icterus.  EOMI. No nystagmus. No conjunctival pallor. PERRL. ENT   Head:   Normocephalic and atraumatic. With laceration at the right supraorbital ridge, approximate 3 cm. Hemostatic   Nose:   No congestion/rhinnorhea.    Mouth/Throat:   MMM, no pharyngeal erythema. No peritonsillar mass.    Neck:   No meningismus.  In c-collar. No significant midline tenderness Hematological/Lymphatic/Immunilogical:    No cervical lymphadenopathy. Cardiovascular:   RRR. Symmetric bilateral radial and DP pulses.  No murmurs.  Respiratory:   Normal respiratory effort without tachypnea/retractions. Breath sounds are clear and equal bilaterally. No wheezes/rales/rhonchi. Gastrointestinal:   Soft and nontender. Non distended. There is no CVA tenderness.  No rebound, rigidity, or guarding. Genitourinary:   deferred Musculoskeletal:   Normal range of motion in all extremities. No joint effusions.  No lower extremity tenderness.  No edema. No midline spinal tenderness Neurologic:   Confused. Able to answer some questions appropriately. Follows commands..  Motor grossly intact. No acute focal neurologic deficits are appreciated.  Skin:    Skin is warm, dry with skin tears over the right MCP joint. No repairable lacerations.  ____________________________________________    LABS (pertinent positives/negatives) (all labs ordered are listed, but only abnormal results are displayed) Labs Reviewed  BASIC METABOLIC PANEL - Abnormal; Notable for the following components:      Result Value   Glucose, Bld 117 (*)    All other components within normal limits  CBC WITH DIFFERENTIAL/PLATELET - Abnormal; Notable for the following components:   Neutro Abs 7.1 (*)    All other components within normal limits  URINALYSIS, COMPLETE (UACMP) WITH MICROSCOPIC - Abnormal; Notable for the following components:   Color, Urine YELLOW (*)    APPearance CLEAR (*)    Hgb urine dipstick MODERATE (*)    Protein, ur 30 (*)    All other components within normal limits   ____________________________________________   EKG    ____________________________________________    RADIOLOGY  Ct Head Wo Contrast  Result Date: 11/28/2017 CLINICAL DATA:  84 year old male status post fall today, with. Altered mental status now. EXAM: CT HEAD WITHOUT CONTRAST CT CERVICAL SPINE WITHOUT CONTRAST TECHNIQUE: Multidetector CT imaging of the head  and cervical spine was performed following the standard protocol without intravenous contrast. Multiplanar CT image reconstructions of the cervical spine were also generated. COMPARISON:  Head and cervical spine CT 11/26/2016. FINDINGS: CT HEAD FINDINGS Brain: Wide ranging right hemispheric extra-axial hemorrhage appears to be a subdural hematoma with thickness of 13-15 millimeters. Associated mass effect on the right hemisphere including partial effacement of the right lateral ventricle. There is leftward midline shift of 5-6 millimeters. No other acute intracranial hemorrhage identified. Basilar cisterns remain normal. The left lateral, 3rd and 4th ventricle size is stable. Stable gray-white matter differentiation throughout the brain. Patchy and confluent bilateral cerebral white matter hypodensity. Vascular: Calcified atherosclerosis at the skull base. No suspicious intracranial vascular hyperdensity. Skull: No skull fracture identified. Sinuses/Orbits: Chronic bilateral paranasal sinus mucosal thickening is stable since 2018. Bilateral tympanic cavities and mastoids remain clear.  Other: Right supraorbital and forehead scalp hematoma measuring up to 9 millimeters in thickness. Underlying right frontal bone is intact. Negative orbit soft tissues and visible noncontrast deep soft tissue spaces of the face. CT CERVICAL SPINE FINDINGS Alignment: Stable since 2018. Mild chronic anterolisthesis at C3-C4 and C4-C5. Chronic C2-C3 posterior element ankylosis more so on the left. There is also chronic posterior element ankylosis at C5-C6 greater on the right. Stable bilateral posterior element alignment. Cervicothoracic junction alignment is within normal limits. Skull base and vertebrae: Visualized skull base is intact. No atlanto-occipital dissociation. No cervical spine fracture identified. Soft tissues and spinal canal: No prevertebral fluid or swelling. No visible canal hematoma. Negative noncontrast neck soft  tissues. Disc levels: Widespread chronic disc degeneration and bilateral cervical facet degeneration. Capacious spinal canal. No cervical spinal stenosis suspected. Upper chest: The visible upper thoracic levels appears stable and intact. Mild atelectasis in the lung apices. IMPRESSION: 1. Broad-based right side subdural hematoma measuring up to 15 mm in thickness associated with intracranial mass effect including leftward midline shift of 5-6 mm. 2. Right supraorbital scalp hematoma.  No skull fracture identified. No other acute traumatic injury to the brain identified. 3.  No acute fracture or listhesis in the cervical spine. 4. Chronic paranasal sinus disease. Electronically Signed   By: Genevie Ann M.D.   On: 11/28/2017 17:22   Ct Cervical Spine Wo Contrast  Result Date: 11/28/2017 CLINICAL DATA:  84 year old male status post fall today, with. Altered mental status now. EXAM: CT HEAD WITHOUT CONTRAST CT CERVICAL SPINE WITHOUT CONTRAST TECHNIQUE: Multidetector CT imaging of the head and cervical spine was performed following the standard protocol without intravenous contrast. Multiplanar CT image reconstructions of the cervical spine were also generated. COMPARISON:  Head and cervical spine CT 11/26/2016. FINDINGS: CT HEAD FINDINGS Brain: Wide ranging right hemispheric extra-axial hemorrhage appears to be a subdural hematoma with thickness of 13-15 millimeters. Associated mass effect on the right hemisphere including partial effacement of the right lateral ventricle. There is leftward midline shift of 5-6 millimeters. No other acute intracranial hemorrhage identified. Basilar cisterns remain normal. The left lateral, 3rd and 4th ventricle size is stable. Stable gray-white matter differentiation throughout the brain. Patchy and confluent bilateral cerebral white matter hypodensity. Vascular: Calcified atherosclerosis at the skull base. No suspicious intracranial vascular hyperdensity. Skull: No skull fracture  identified. Sinuses/Orbits: Chronic bilateral paranasal sinus mucosal thickening is stable since 2018. Bilateral tympanic cavities and mastoids remain clear. Other: Right supraorbital and forehead scalp hematoma measuring up to 9 millimeters in thickness. Underlying right frontal bone is intact. Negative orbit soft tissues and visible noncontrast deep soft tissue spaces of the face. CT CERVICAL SPINE FINDINGS Alignment: Stable since 2018. Mild chronic anterolisthesis at C3-C4 and C4-C5. Chronic C2-C3 posterior element ankylosis more so on the left. There is also chronic posterior element ankylosis at C5-C6 greater on the right. Stable bilateral posterior element alignment. Cervicothoracic junction alignment is within normal limits. Skull base and vertebrae: Visualized skull base is intact. No atlanto-occipital dissociation. No cervical spine fracture identified. Soft tissues and spinal canal: No prevertebral fluid or swelling. No visible canal hematoma. Negative noncontrast neck soft tissues. Disc levels: Widespread chronic disc degeneration and bilateral cervical facet degeneration. Capacious spinal canal. No cervical spinal stenosis suspected. Upper chest: The visible upper thoracic levels appears stable and intact. Mild atelectasis in the lung apices. IMPRESSION: 1. Broad-based right side subdural hematoma measuring up to 15 mm in thickness associated with intracranial mass effect including leftward midline shift  of 5-6 mm. 2. Right supraorbital scalp hematoma.  No skull fracture identified. No other acute traumatic injury to the brain identified. 3.  No acute fracture or listhesis in the cervical spine. 4. Chronic paranasal sinus disease. Electronically Signed   By: Genevie Ann M.D.   On: 11/28/2017 17:22    ____________________________________________   PROCEDURES .Critical Care Performed by: Carrie Mew, MD Authorized by: Carrie Mew, MD   Critical care provider statement:    Critical care  time (minutes):  35   Critical care time was exclusive of:  Separately billable procedures and treating other patients   Critical care was necessary to treat or prevent imminent or life-threatening deterioration of the following conditions:  CNS failure or compromise and trauma   Critical care was time spent personally by me on the following activities:  Development of treatment plan with patient or surrogate, discussions with consultants, evaluation of patient's response to treatment, examination of patient, obtaining history from patient or surrogate, ordering and performing treatments and interventions, ordering and review of laboratory studies, ordering and review of radiographic studies, pulse oximetry and re-evaluation of patient's condition  .Marland KitchenLaceration Repair Date/Time: 11/28/2017 6:28 PM Performed by: Carrie Mew, MD Authorized by: Carrie Mew, MD   Consent:    Consent obtained:  Verbal   Consent given by:  Patient   Risks discussed:  Infection, pain, retained foreign body, poor cosmetic result and poor wound healing Anesthesia (see MAR for exact dosages):    Anesthesia method:  None Laceration details:    Location:  Scalp   Scalp location:  Frontal   Length (cm):  4 Repair type:    Repair type:  Simple Pre-procedure details:    Preparation:  Patient was prepped and draped in usual sterile fashion Exploration:    Hemostasis achieved with:  Direct pressure   Wound exploration: entire depth of wound probed and visualized     Wound extent: no foreign bodies/material noted     Contaminated: no   Treatment:    Area cleansed with:  Saline   Amount of cleaning:  Standard   Irrigation solution:  Sterile saline   Visualized foreign bodies/material removed: no   Skin repair:    Repair method:  Tissue adhesive Approximation:    Approximation:  Close Post-procedure details:    Dressing:  Sterile dressing   Patient tolerance of procedure:  Tolerated well, no immediate  complications    ____________________________________________    CLINICAL IMPRESSION / ASSESSMENT AND PLAN / ED COURSE  Pertinent labs & imaging results that were available during my care of the patient were reviewed by me and considered in my medical decision making (see chart for details).   Patient presents with altered mental status after a fall, seizure versus syncope versus mechanical. Reports no other symptoms. At this time there is no suspect ACS PE dissection or AAA. Abdomen is benign. Check EKG, serum labs, CT head and neck to evaluate for possible intracranial hemorrhage or C-spine fracture..  Clinical Course as of Nov 28 1841  Fri Nov 28, 2017  1714 CT imaging viewed by me.  Concerning for acute ICH on R parietal, likely SDH. Will d/w radiology and then son at bedside.  [PS]  1737 D/w radiology. Confirmed SDH on Right side. Relatively sizeable for this early since fall. Some midline shift. D/w family at bedside: son and spouse.  No advance directive. Full code.  Would want full scope of care. Family requests transfer to [facility]. Pt exam stable. PERRL, awake.  Alert.    [PS]  2409 In seeking guidance on how to best facilitate care for this patient, I was informed that [facility] told one of our ED physicians yesterday that they are unwilling to care for [facility] patients.  This is consistent with prior experience with their department. Since attempting transfer to [facility] would be futile and delay care, I will call [facility] now.   [PS]  1806 Bp 160/110   [PS]  7353 Accepted by [facility]. Unable to fly due to weather.   [PS]  1822 Lac reparied.  Iv labetalol 5mg  increments for BP 160/115. Hr 90.  Will transport by [facility] as fastest means of getting pt to receiving hospital. No change in mental status currently. I don't believe pt needs intubation for airway protection at this time. EMS transport requested.   [PS]  [id number] EMS at bedside to transport. Case d/w EMS.   [PS]  1841 MS  stable.    [PS]    Clinical Course User Index [PS] Carrie Mew, MD     ----------------------------------------- 6:43 PM on 11/28/2017 -----------------------------------------  Patient out the door with EMS. No change in mental status during 2 hours ED. GCS 14  ____________________________________________   FINAL CLINICAL IMPRESSION(S) / ED DIAGNOSES    Final diagnoses:  Acute subdural hematoma (HCC)       Portions of this note were generated with dragon dictation software. Dictation errors may occur despite best attempts at proofreading.    Carrie Mew, MD 11/28/17 [phone]

## 2017-11-28 NOTE — ED Triage Notes (Signed)
Pt to ED via EMS picked up from [facility] c/o trip and fall, fell forward hitting head and did not brace the impact.  Per EMS son with patient, has hx alzheimer's but is normally conversational but is not responding appropriately.  Pt has c-collar and spine board in place upon arrival.  Pt had loss of bowel, ems states not on blood thinners, following commands and pupils bilateral 3 equal and reactive.  EMS vitals 90 HR, 94% RA, 167/117 BP, 142 CBG.

## 2017-11-28 NOTE — ED Notes (Signed)
Called [facility] for transport to [facility] ED  [phone]

## 2017-11-28 NOTE — Progress Notes (Signed)
Brett Darby, MD [address]  [place]  [place], [place] [address]  Main: [phone]  Fax: [phone]    Gastroenterology Consultation  Referring Provider:     Arnetha Courser, MD Primary Care Physician:  Brett Courser, MD Primary Gastroenterologist:  Dr. Cephas Floyd Reason for Consultation:     Elevated bilirubin        HPI:   Brett Floyd is a 84 y.o. male referred by Dr. Arnetha Courser, MD  for consultation & management of elevated bilirubin. Was noted to be initially elevated in 07/2017. Most recently on 11/20/2017. Total bilirubin was 1.7, indirect bilirubin 1.4, direct bilirubin 0.3. Rest of the LFTs are normal. He underwent right upper quadrant ultrasound which revealed fatty liver. Normal caliber CBD. Patient denies right upper quadrant pain, nausea, vomiting, fever, chills. He is recently diagnosed with ulcerative worse dementia. He is accompanied by his son today who reports that for the last 1 month he hasn't been eating well due to lack of teeth and new diagnosis of Alzheimer's. Lost about 6 pounds in last 1-2 months, it is now stable. He also reports intermittent episodes of significant bloating, bowel urgency, 3-4 bowel movements per day. He denies abdominal pain. Denies blood in the stools. His son reports that patient has been drinking 8-12 ounces of soda daily. He was not a smoker in the past. He does not drink alcohol. His hemoglobin has been normal.  NSAIDs: None  Antiplts/Anticoagulants/Anti thrombotics: None  GI Procedures: May had a colonoscopy several years ago, does not recall the results  Past Medical History:  Diagnosis Date  . Hypertension     Past Surgical History:  Procedure Laterality Date  . HERNIA REPAIR    . MOUTH SURGERY      Prior to Admission medications   Medication Sig Start Date End Date Taking? Authorizing Provider  aspirin EC 81 MG tablet Take 1 tablet (81 mg total) by mouth daily. 06/21/16  Yes Brett Floyd, Brett Anis, MD  donepezil (ARICEPT) 5 MG tablet TAKE 1 TABLET BY MOUTH EVERY DAY AT NIGHT 11/19/17  Yes [provider]  lisinopril (PRINIVIL,ZESTRIL) 5 MG tablet Take 1 tablet (5 mg total) by mouth daily. 11/20/17  Yes Brett Hartshorn, FNP  triamcinolone cream (KENALOG) 0.1 % Apply 1 application 2 (two) times daily topically. (to the left arm) 08/14/17  Yes Brett Floyd, Brett Anis, MD    Family History  Problem Relation Age of Onset  . Cancer Mother        pancreatic cancer  . Hypertension Father   . Stroke Father   . Heart disease Father   . Hypertension Son      Social History   Tobacco Use  . Smoking status: Never Smoker  . Smokeless tobacco: Never Used  Substance Use Topics  . Alcohol use: No  . Drug use: No    Allergies as of 11/28/2017 - Review Complete 11/28/2017  Allergen Reaction Noted  . Erythromycin Nausea Only 10/17/2016    Review of Systems:    All systems reviewed and negative except where noted in HPI.   Physical Exam:  BP (!) 134/94   Pulse 90   Temp 98.1 F (36.7 C)   Wt 162 lb 6.4 oz (73.7 kg)   BMI 25.44 kg/m  No LMP for male patient.  General:   Alert,  Well-developed, well-nourished, pleasant and cooperative in NAD Head:  Normocephalic and atraumatic. Eyes:  Sclera clear, no icterus.  Conjunctiva pink. Ears:  Normal auditory acuity. Nose:  No deformity, discharge, or lesions. Mouth:  No deformity or lesions,oropharynx pink & moist. Neck:  Supple; no masses or thyromegaly. Lungs:  Respirations even and unlabored.  Clear throughout to auscultation.   No wheezes, crackles, or rhonchi. No acute distress. Heart:  Regular rate and rhythm; no murmurs, clicks, rubs, or gallops. Abdomen:  Normal bowel sounds. Soft, non-tender and distended, tympanic, without masses, hepatosplenomegaly or hernias noted.  No guarding or rebound tenderness.   Rectal: Not performed Msk:  Symmetrical without gross deformities. Good, equal movement & strength bilaterally. Pulses:   Normal pulses noted. Extremities:  No clubbing or edema.  No cyanosis. Neurologic:  Alert and oriented x3;  grossly normal neurologically. Skin:  Intact without significant lesions or rashes. No jaundice. Lymph Nodes:  No significant cervical adenopathy. Psych:  Alert and cooperative. Normal mood and affect.  Imaging Studies: Ultrasound 10/17/2017 IMPRESSION: Increased hepatic echogenicity consistent with fatty infiltration and/or hepatocellular disease. No acute or focal abnormality. No gallstones or biliary distention.  Assessment and Plan:   Brett Floyd is a 84 y.o. male with no significant past medical history, admitted diagnosis of ulcerative worse dementia, found to have isolated hyperbilirubinemia and 1 month history of abdominal bloating and increased bowel frequency. Patient doesn't have other alarm signs or symptoms  Isolated hyperbilirubinemia: Predominantly indirect bilirubin, which suggests that he may have Gilbert's Ultrasound with no evidence of biliary obstruction and no other liver pathology except fatty liver No further workup needed at this time and no need to monitor LFTs  Abdominal bloating with diarrhea, nonbloody: - Most likely secondary to consumption of carbonated beverages - Advised him to stop drinking sodas - If his symptoms are persistent, we'll perform further workup to rule out other etiologies  Follow up in 2 months   Brett Darby, MD

## 2017-11-29 MED ORDER — TRAMADOL HCL 50 MG PO TABS
50.00 mg | ORAL_TABLET | ORAL | Status: DC
Start: ? — End: 2017-11-29

## 2017-11-29 MED ORDER — SENNOSIDES 8.6 MG PO TABS
2.00 | ORAL_TABLET | ORAL | Status: DC
Start: 2017-11-29 — End: 2017-11-29

## 2017-11-29 MED ORDER — ACETAMINOPHEN 325 MG PO TABS
650.00 mg | ORAL_TABLET | ORAL | Status: DC
Start: ? — End: 2017-11-29

## 2017-11-29 MED ORDER — PANTOPRAZOLE SODIUM 40 MG IV SOLR
40.00 mg | INTRAVENOUS | Status: DC
Start: 2017-11-30 — End: 2017-11-29

## 2017-11-29 MED ORDER — POLYETHYLENE GLYCOL 3350 17 G PO PACK
17.00 | PACK | ORAL | Status: DC
Start: 2017-11-30 — End: 2017-11-29

## 2017-11-29 MED ORDER — BACITRACIN 500 UNIT/GM EX OINT
TOPICAL_OINTMENT | CUTANEOUS | Status: DC
Start: 2017-11-29 — End: 2017-11-29

## 2017-12-04 ENCOUNTER — Ambulatory Visit: Payer: PPO | Admitting: Family Medicine

## 2017-12-12 ENCOUNTER — Telehealth: Payer: Self-pay | Admitting: Family Medicine

## 2017-12-12 NOTE — Telephone Encounter (Signed)
Patient passed away this morning at [facility] I spoke with son He just saw GI, got good news; he got up to use restroom, pouring down rain; son was helping his mother, patient stumbled and fell and hit head right on the concrete; never moved, never responded; EMS got there, [facility] to [facility] and had a SDH, went to [facility], ground transport, stayed in surg/trauma ICU; was improving some, could talk and respond and even stood up, then went to stepdown unit, then went out again; another scan showed bleed had started again and shifted his brain; total unresponsive, then he described [facility] resp; died yesterday morning; he is a medical examiner case so they haven't released his body to the son; patient's wife is numb to it all, really just floored them; offered condolences

## 2017-12-17 ENCOUNTER — Other Ambulatory Visit: Payer: Self-pay | Admitting: Family Medicine

## 2017-12-17 DIAGNOSIS — I10 Essential (primary) hypertension: Secondary | ICD-10-CM

## 2017-12-29 DEATH — deceased

## 2018-02-06 ENCOUNTER — Ambulatory Visit: Payer: PPO | Admitting: Gastroenterology

## 2019-08-22 IMAGING — CT CT CERVICAL SPINE W/O CM
4 of 8 series · 14 of 33 positions shown, 15 images · non-contrast
Comparison: Head and cervical spine CT 11/26/2016.

CLINICAL DATA: 84-year-old male status post fall today, with.
Altered mental status now.

EXAM:
CT HEAD WITHOUT CONTRAST
CT CERVICAL SPINE WITHOUT CONTRAST
TECHNIQUE: Multidetector CT imaging of the head and cervical spine was
performed following the standard protocol without intravenous
contrast. Multiplanar CT image reconstructions of the cervical spine
were also generated.

[Series 6: coronal soft tissue · coronal · 0.32mm/px · 3 of 66 slices shown]
[im 17/66  bone]
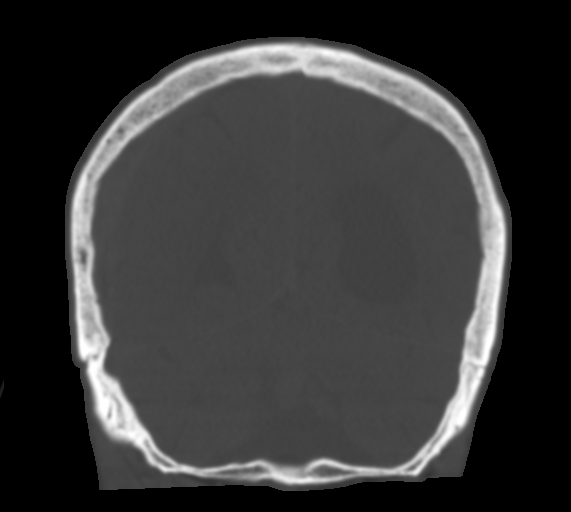
[im 33/66  bone]
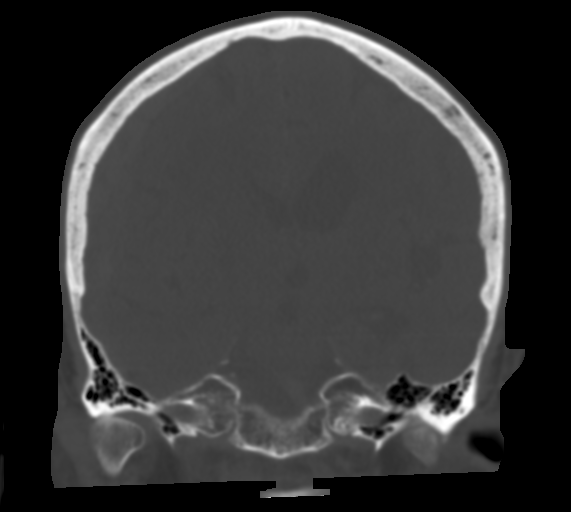
[im 49/66  bone]
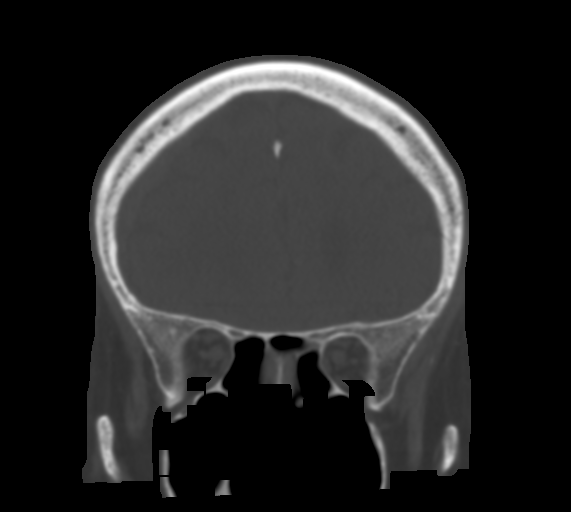

[Series 9: c spine soft · axial · 0.36mm/px · z∈[+163,+241]mm · 3 of 79 slices shown]
[im 20/79  soft-tissue]
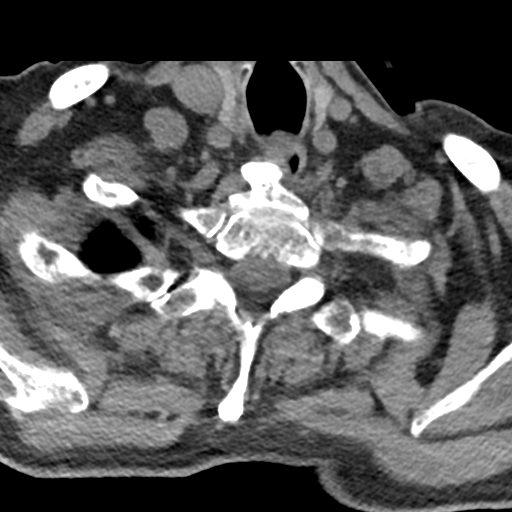
[im 40/79  soft-tissue]
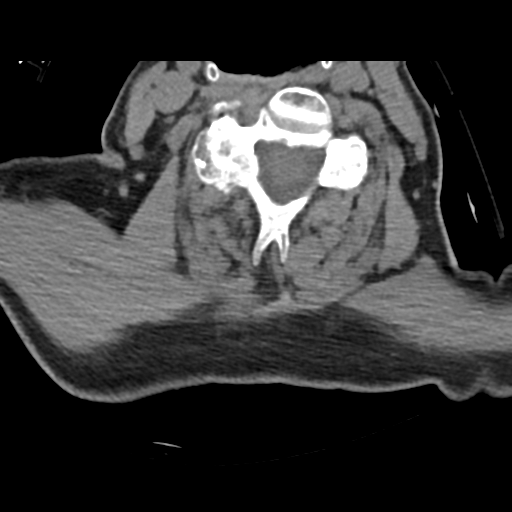
[im 59/79  soft-tissue]
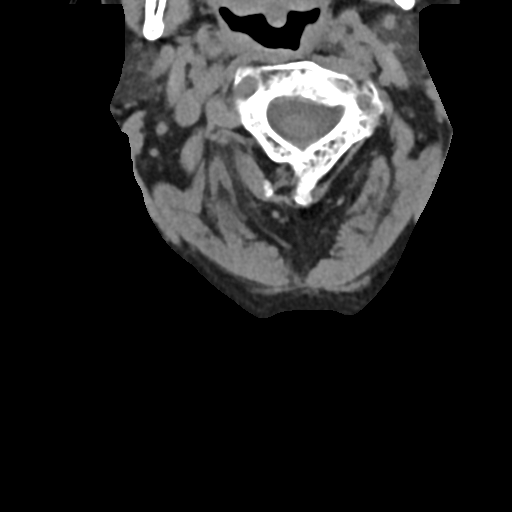

[Series 10: sagittal bone · sagittal · 0.30mm/px · 4 of 50 slices shown]
[im 10/50  bone]
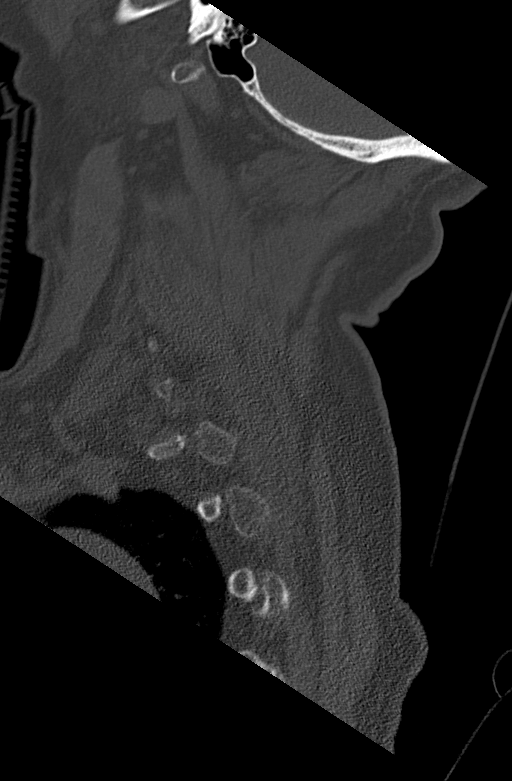
[im 20/50  bone]
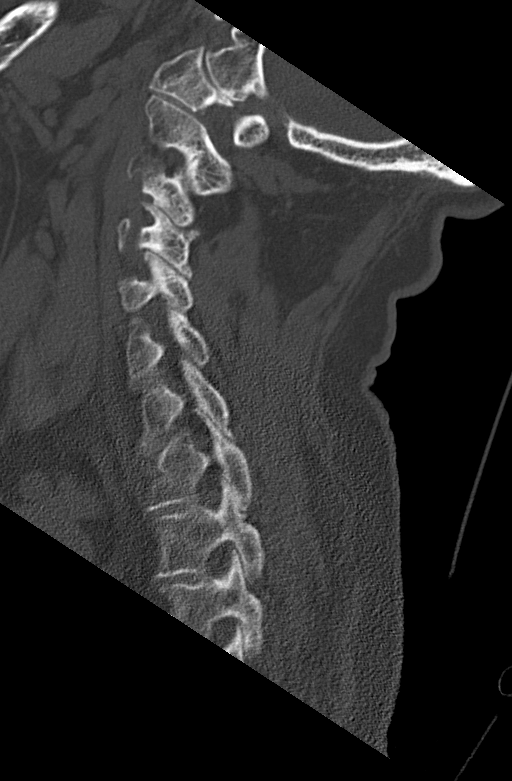
[im 30/50  bone]
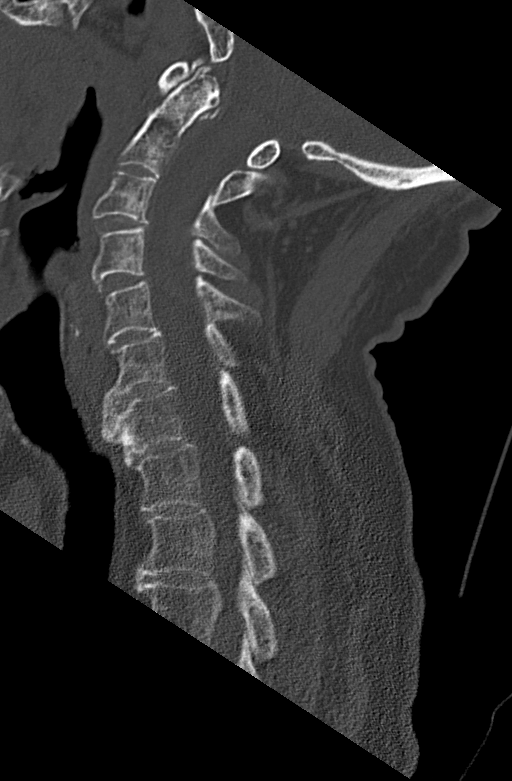
[im 40/50  bone]
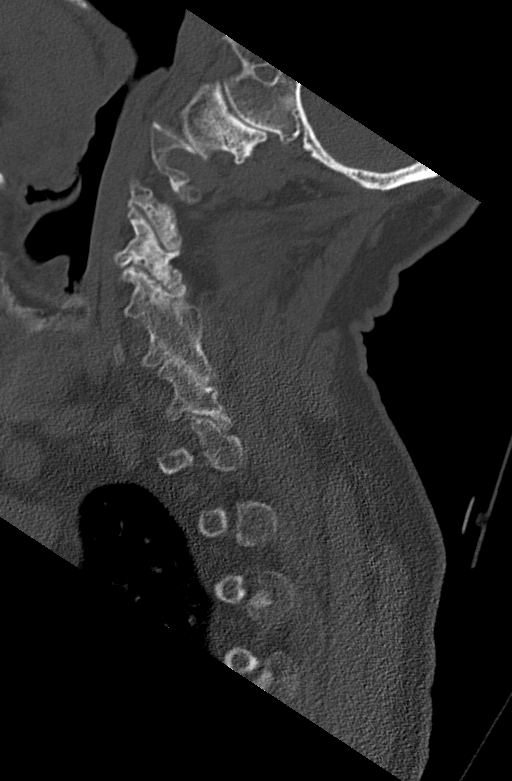

[Series 12: orthogonal bone · axial · 0.24mm/px · z∈[+121,+215]mm · 4 of 96 slices shown, 5 images]
[im 20/96  soft-tissue]
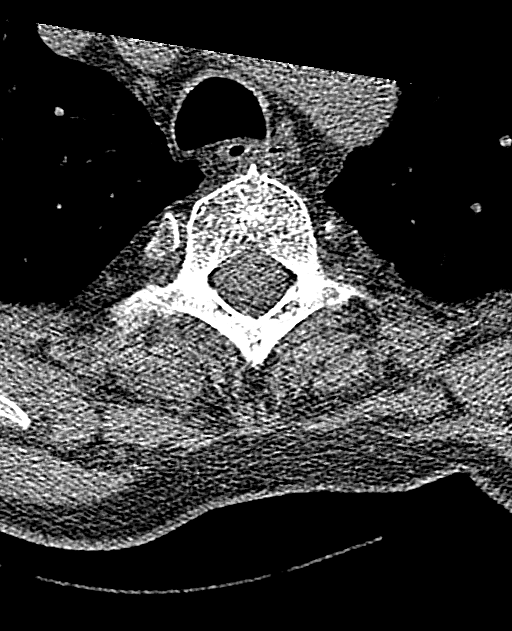
[im 20/96  bone]
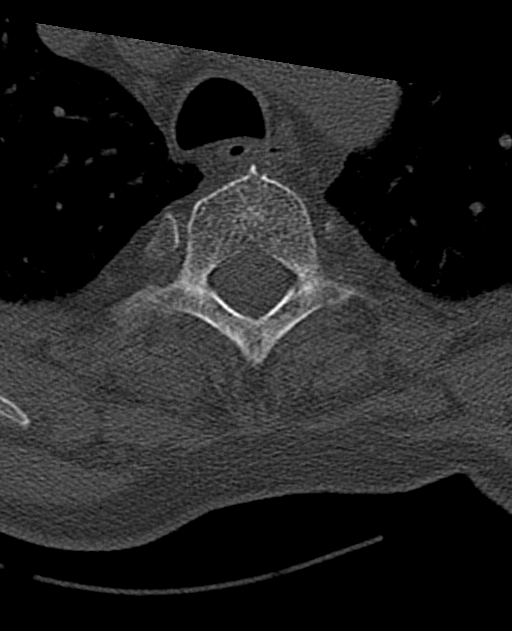
[im 39/96  bone]
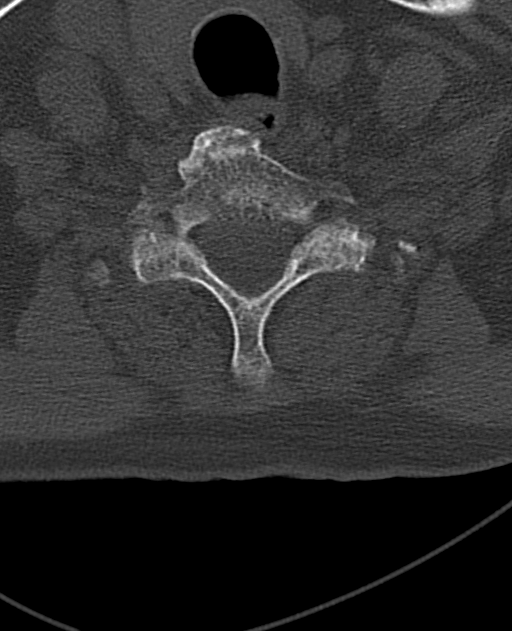
[im 58/96  bone]
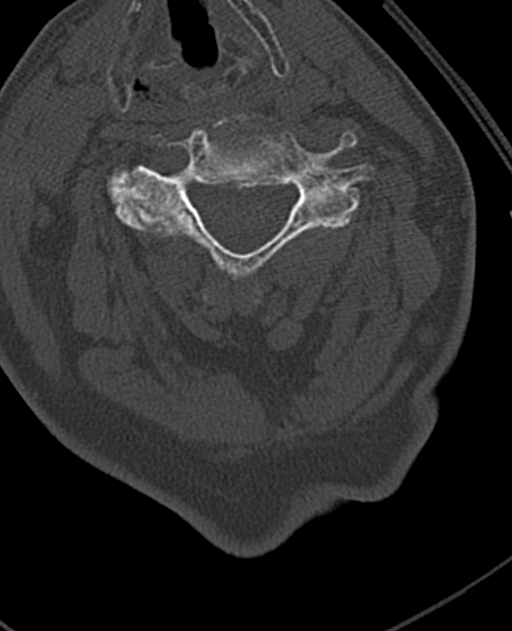
[im 77/96  bone]
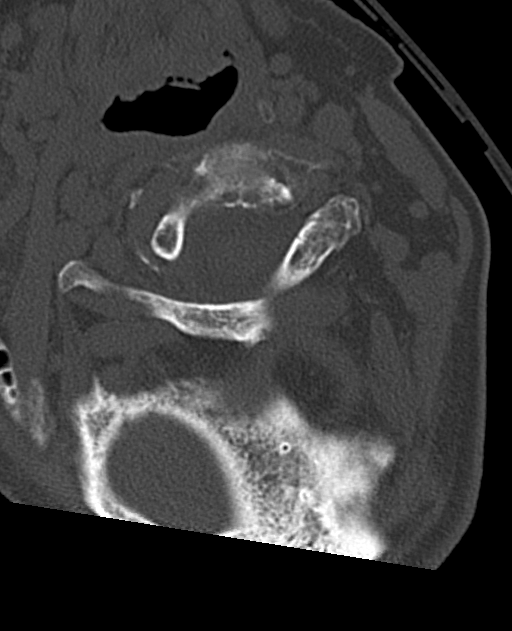

[14 of 33 positions shown; findings below may reference images not displayed]

FINDINGS: CT HEAD FINDINGS

Brain: Wide ranging right hemispheric extra-axial hemorrhage appears
to be a subdural hematoma with thickness of 13-15 millimeters.
Associated mass effect on the right hemisphere including partial
effacement of the right lateral ventricle. There is leftward midline
shift of 5-6 millimeters.

No other acute intracranial hemorrhage identified. Basilar cisterns
remain normal. The left lateral, 3rd and 4th ventricle size is
stable. Stable gray-white matter differentiation throughout the
brain. Patchy and confluent bilateral cerebral white matter
hypodensity.

Vascular: Calcified atherosclerosis at the skull base. No suspicious
intracranial vascular hyperdensity.

Skull: No skull fracture identified.

Sinuses/Orbits: Chronic bilateral paranasal sinus mucosal thickening
is stable since 8584. Bilateral tympanic cavities and mastoids
remain clear.

Other: Right supraorbital and forehead scalp hematoma measuring up
to 9 millimeters in thickness. Underlying right frontal bone is
intact. Negative orbit soft tissues and visible noncontrast deep
soft tissue spaces of the face.

CT CERVICAL SPINE FINDINGS

Alignment: Stable since 8584. Mild chronic anterolisthesis at C3-C4
and C4-C5. Chronic C2-C3 posterior element ankylosis more so on the
left. There is also chronic posterior element ankylosis at C5-C6
greater on the right. Stable bilateral posterior element alignment.
Cervicothoracic junction alignment is within normal limits.

Skull base and vertebrae: Visualized skull base is intact. No
atlanto-occipital dissociation. No cervical spine fracture
identified.

Soft tissues and spinal canal: No prevertebral fluid or swelling. No
visible canal hematoma. Negative noncontrast neck soft tissues.

Disc levels: Widespread chronic disc degeneration and bilateral
cervical facet degeneration. Capacious spinal canal. No cervical
spinal stenosis suspected.

Upper chest: The visible upper thoracic levels appears stable and
intact. Mild atelectasis in the lung apices.
IMPRESSION: 1. Broad-based right side subdural hematoma measuring up to 15 mm in
thickness associated with intracranial mass effect including
leftward midline shift of 5-6 mm.
2. Right supraorbital scalp hematoma.  No skull fracture identified.
No other acute traumatic injury to the brain identified.
3.  No acute fracture or listhesis in the cervical spine.
4. Chronic paranasal sinus disease.
